# Patient Record
Sex: Male | Born: 1940
Health system: Southern US, Community
[De-identification: ages and names within clinical notes are randomized; demographics above are authoritative.]

## PROBLEM LIST (undated history)

## (undated) DIAGNOSIS — L989 Disorder of the skin and subcutaneous tissue, unspecified: Secondary | ICD-10-CM

## (undated) DIAGNOSIS — F419 Anxiety disorder, unspecified: Secondary | ICD-10-CM

## (undated) DIAGNOSIS — I6529 Occlusion and stenosis of unspecified carotid artery: Secondary | ICD-10-CM

## (undated) DIAGNOSIS — N400 Enlarged prostate without lower urinary tract symptoms: Secondary | ICD-10-CM

## (undated) DIAGNOSIS — M1990 Unspecified osteoarthritis, unspecified site: Secondary | ICD-10-CM

## (undated) HISTORY — DX: Unspecified osteoarthritis, unspecified site: M19.90

## (undated) HISTORY — DX: Anxiety disorder, unspecified: F41.9

## (undated) HISTORY — PX: NO PAST SURGERIES: SHX2092

## (undated) HISTORY — DX: Benign prostatic hyperplasia without lower urinary tract symptoms: N40.0

## (undated) HISTORY — DX: Disorder of the skin and subcutaneous tissue, unspecified: L98.9

## (undated) HISTORY — DX: Occlusion and stenosis of unspecified carotid artery: I65.29

## (undated) HISTORY — PX: CORONARY ARTERY BYPASS GRAFT: SHX141

---

## 2004-01-25 ENCOUNTER — Inpatient Hospital Stay (HOSPITAL_COMMUNITY): Admission: AD | Admit: 2004-01-25 | Discharge: 2004-01-31 | Payer: Self-pay | Admitting: Cardiology

## 2004-02-25 ENCOUNTER — Encounter: Admission: RE | Admit: 2004-02-25 | Discharge: 2004-02-25 | Payer: Self-pay | Admitting: Allergy and Immunology

## 2005-04-07 IMAGING — CR DG CHEST 2V
2 series · 2 of 2 positions shown · non-contrast
Comparison: none

CLINICAL DATA: Chest pain. 
 PA AND LATERAL CHEST ([DATE] A.M.)  
 No comparisons.

[view not recorded (1 of 2)]
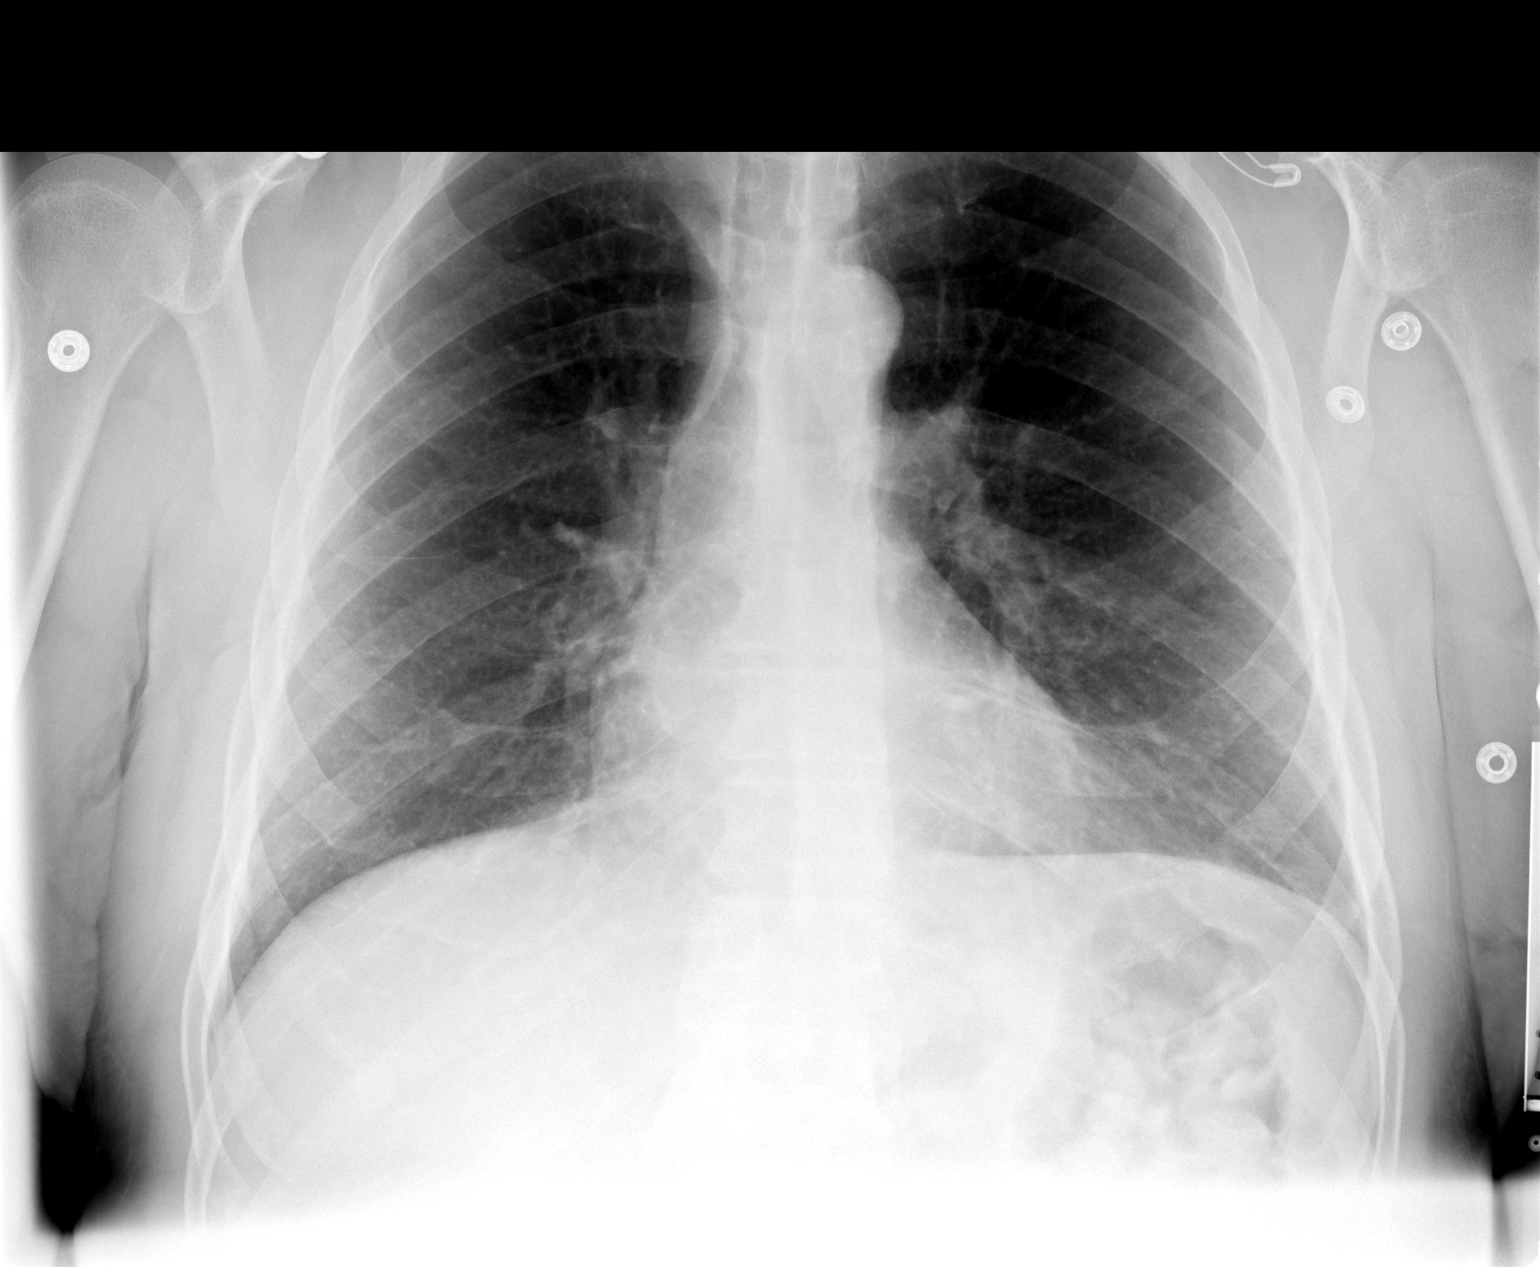

[view not recorded (2 of 2)]
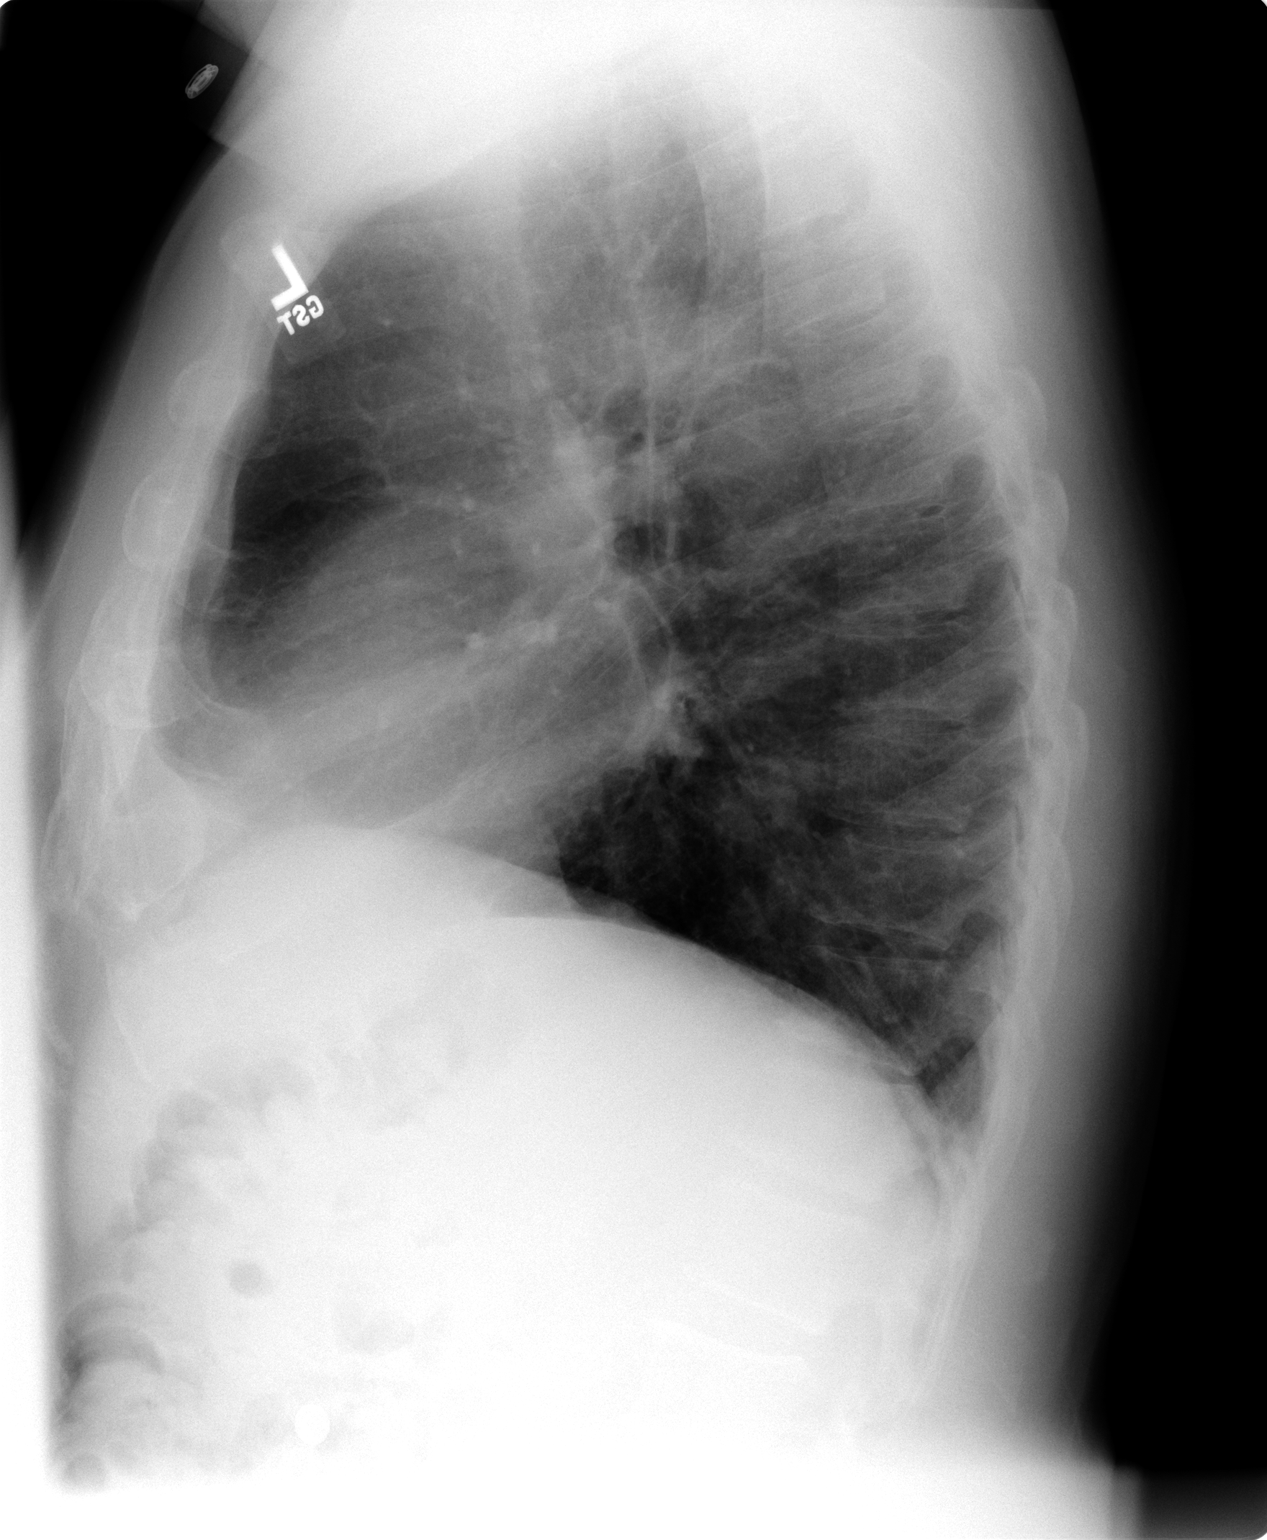

[2 of 2 positions shown; findings below may reference images not displayed]

FINDINGS: The heart size is top normal.  No evidence of infiltrate or congestive heart failure.  
 IMPRESSION
 No evidence of acute abnormality.

## 2005-04-08 IMAGING — CR DG CHEST 1V PORT
1 series · 1 of 1 positions shown · non-contrast
Comparison: none

CLINICAL DATA: CABG.  Chest pain.  
 PORTABLE CHEST ONE VIEW 01/27/04 AT 1269 HOURS
 Patient is status post CABG.  Portable film at 1269 hours.  ET tube, NG tube, Swan Ganz catheter, mediastinal tube, left chest tube all in good position.  Bibasilar subsegmental atelectatic change left greater than right with small effusions.  No pneumothorax. 
 IMPRESSION
 Satisfactory postoperative appearance with slight worsening aeration compared to preoperative film.

[view not recorded]
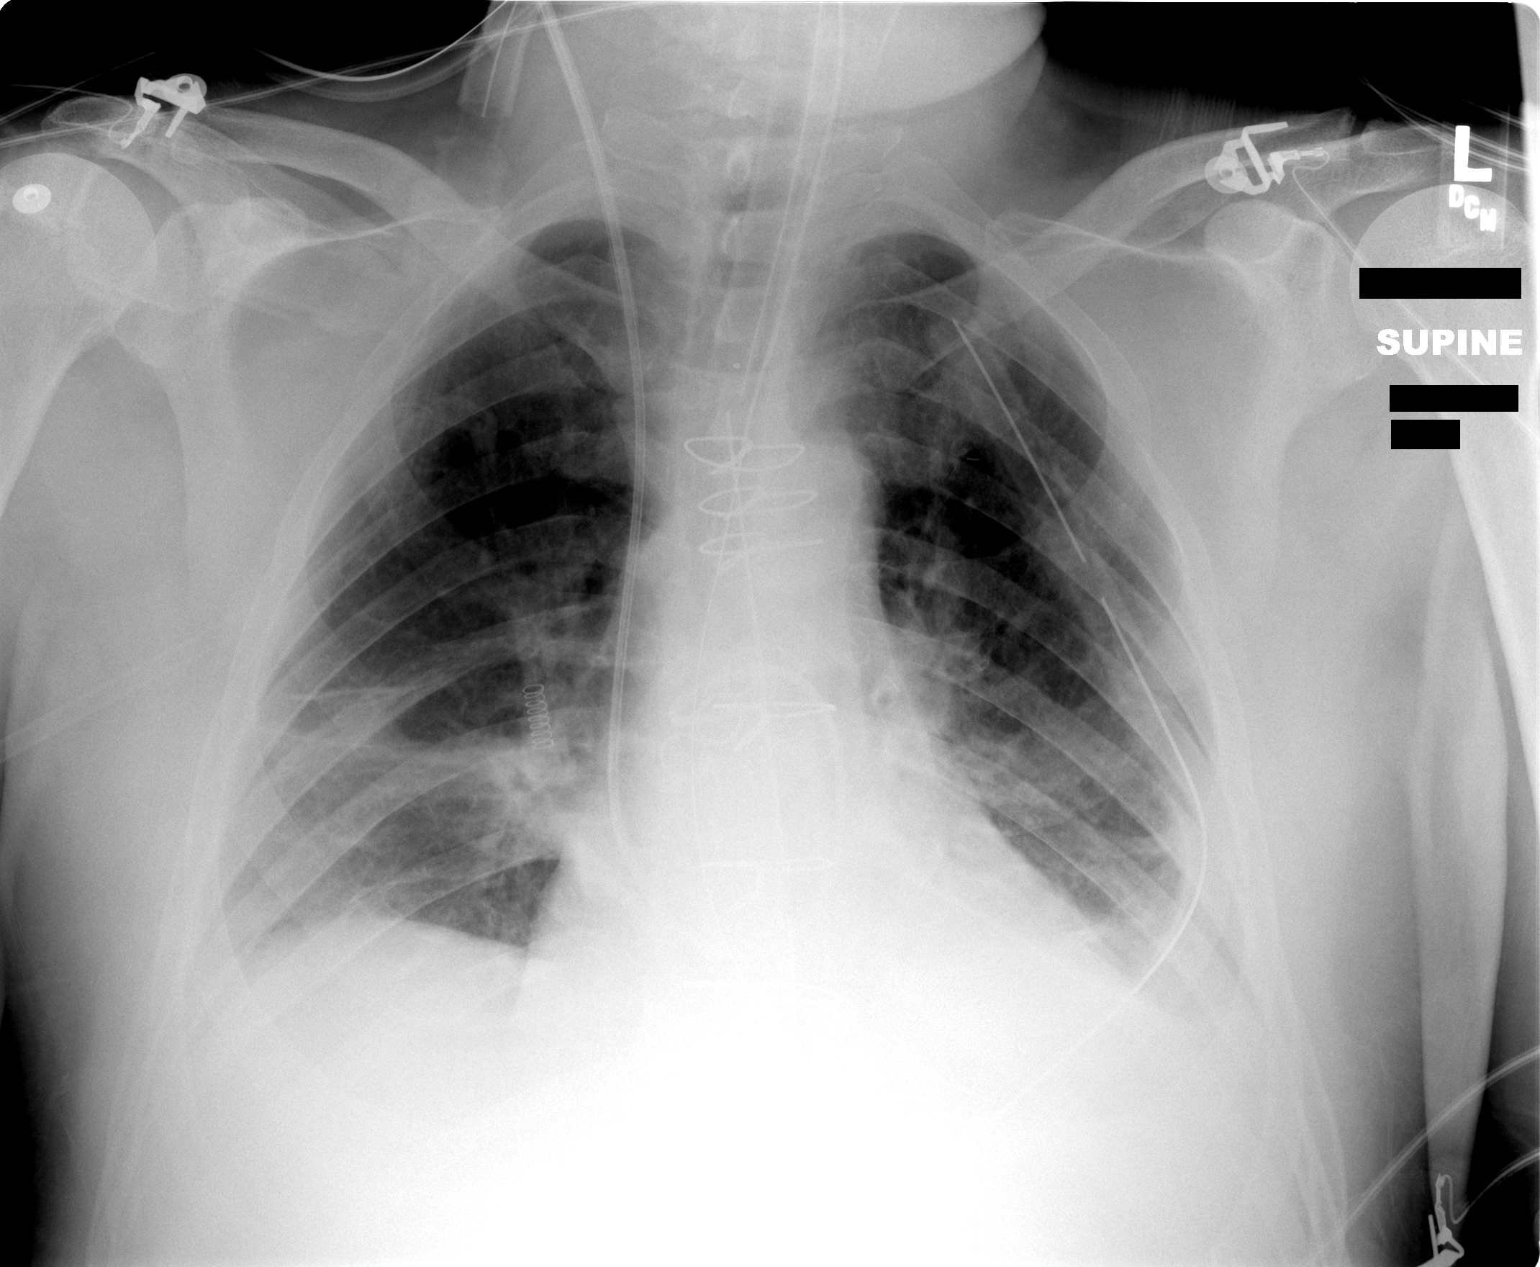

[1 of 1 positions shown; findings below may reference images not displayed]

## 2005-04-09 IMAGING — CR DG CHEST 1V PORT
1 series · 1 of 1 positions shown · non-contrast
Comparison: 01/27/04
 The patient is status post extubation.

CLINICAL DATA: Status post CABG procedure. 
 PORTABLE CHEST ? 01/28/04

[view not recorded]
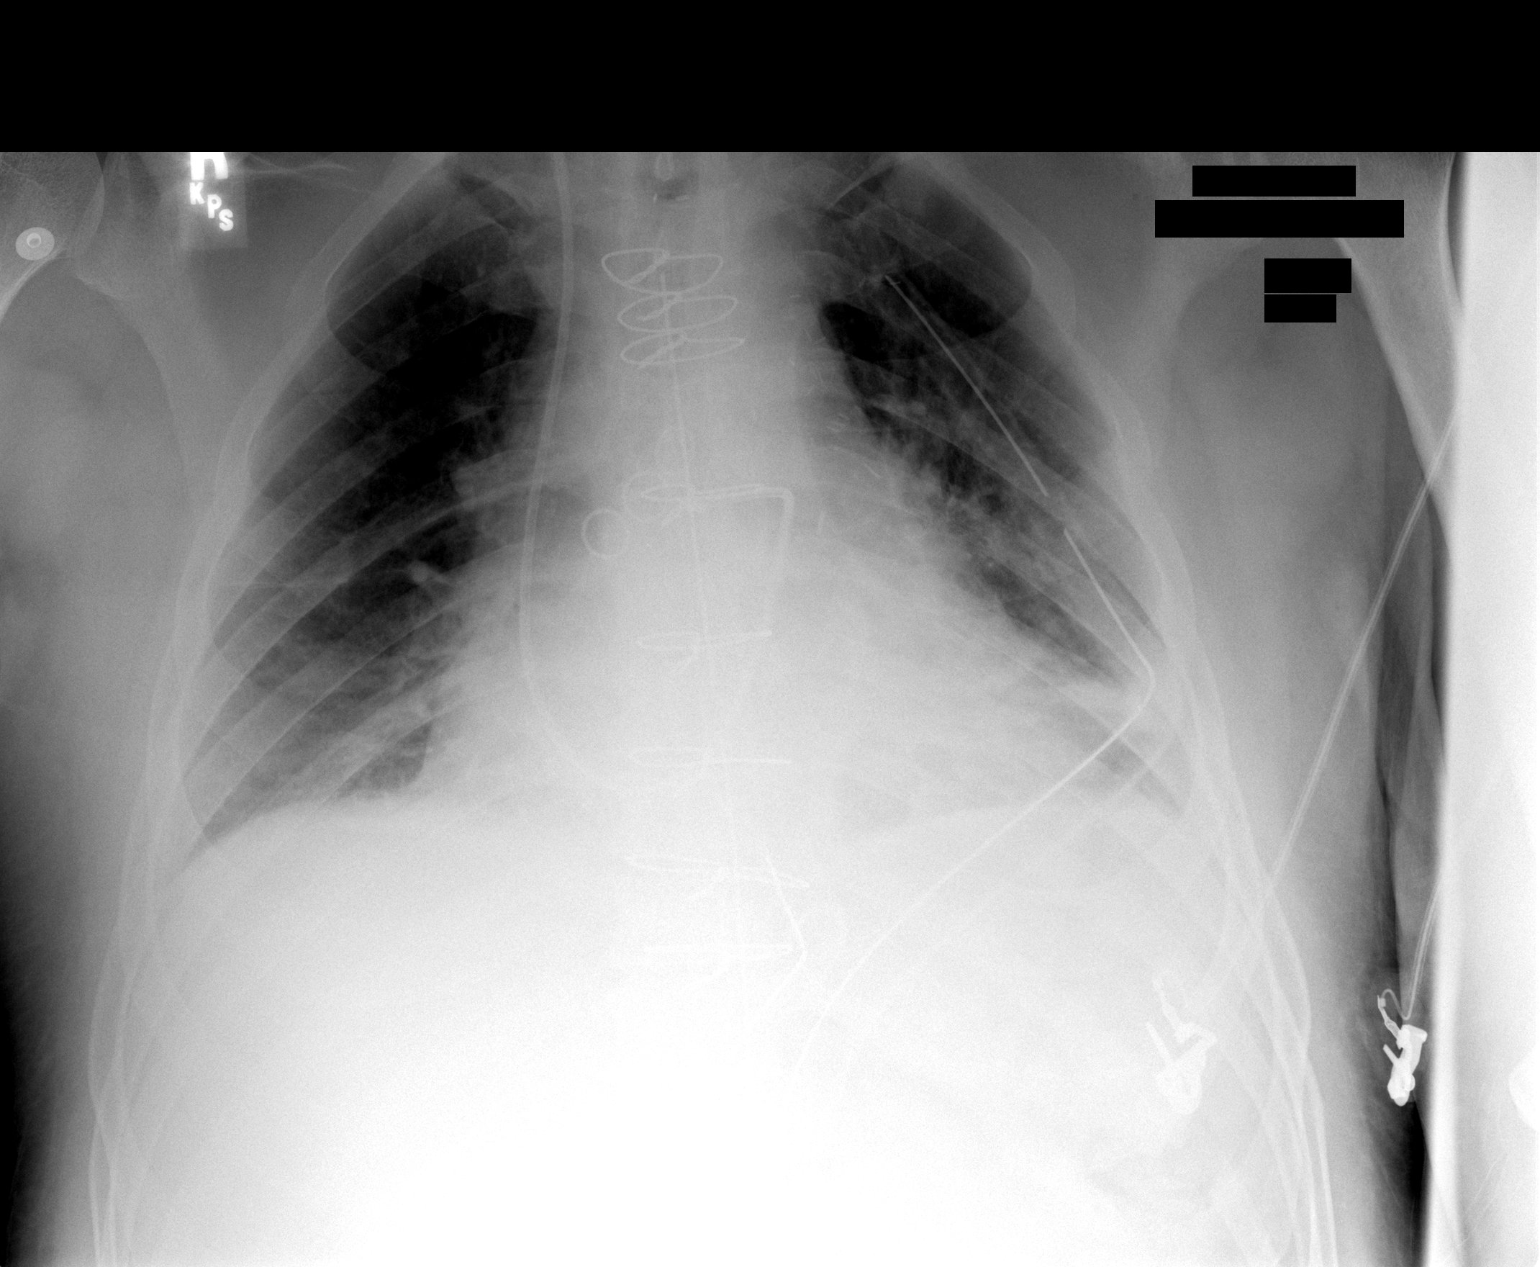

[1 of 1 positions shown; findings below may reference images not displayed]

Swan-ganz catheter and left chest tube remain in satisfactory position.  Bilateral subsegmental atelectasis.  Vascular congestion without frank edema.  Increased lingular atelectasis noted.  
 IMPRESSION 
 Follow-up extubation with increased atelectasis.

## 2015-10-26 DIAGNOSIS — H401133 Primary open-angle glaucoma, bilateral, severe stage: Secondary | ICD-10-CM | POA: Diagnosis not present

## 2015-11-30 DIAGNOSIS — H9193 Unspecified hearing loss, bilateral: Secondary | ICD-10-CM | POA: Diagnosis not present

## 2015-12-30 DIAGNOSIS — Z0000 Encounter for general adult medical examination without abnormal findings: Secondary | ICD-10-CM | POA: Diagnosis not present

## 2016-02-15 DIAGNOSIS — I10 Essential (primary) hypertension: Secondary | ICD-10-CM | POA: Diagnosis not present

## 2016-03-01 DIAGNOSIS — H401133 Primary open-angle glaucoma, bilateral, severe stage: Secondary | ICD-10-CM | POA: Diagnosis not present

## 2016-07-03 DIAGNOSIS — H401133 Primary open-angle glaucoma, bilateral, severe stage: Secondary | ICD-10-CM | POA: Diagnosis not present

## 2016-07-20 DIAGNOSIS — H903 Sensorineural hearing loss, bilateral: Secondary | ICD-10-CM | POA: Diagnosis not present

## 2016-09-01 DIAGNOSIS — N3090 Cystitis, unspecified without hematuria: Secondary | ICD-10-CM | POA: Diagnosis not present

## 2016-09-01 DIAGNOSIS — R300 Dysuria: Secondary | ICD-10-CM | POA: Diagnosis not present

## 2016-09-10 DIAGNOSIS — Z23 Encounter for immunization: Secondary | ICD-10-CM | POA: Diagnosis not present

## 2016-11-15 DIAGNOSIS — H401133 Primary open-angle glaucoma, bilateral, severe stage: Secondary | ICD-10-CM | POA: Diagnosis not present

## 2016-11-27 DIAGNOSIS — E785 Hyperlipidemia, unspecified: Secondary | ICD-10-CM | POA: Diagnosis not present

## 2016-11-27 DIAGNOSIS — Z0000 Encounter for general adult medical examination without abnormal findings: Secondary | ICD-10-CM | POA: Diagnosis not present

## 2016-11-27 DIAGNOSIS — Z6827 Body mass index (BMI) 27.0-27.9, adult: Secondary | ICD-10-CM | POA: Diagnosis not present

## 2016-11-27 DIAGNOSIS — I10 Essential (primary) hypertension: Secondary | ICD-10-CM | POA: Diagnosis not present

## 2017-01-09 DIAGNOSIS — E785 Hyperlipidemia, unspecified: Secondary | ICD-10-CM | POA: Diagnosis not present

## 2017-01-09 DIAGNOSIS — Z79899 Other long term (current) drug therapy: Secondary | ICD-10-CM | POA: Diagnosis not present

## 2017-01-09 DIAGNOSIS — Z0000 Encounter for general adult medical examination without abnormal findings: Secondary | ICD-10-CM | POA: Diagnosis not present

## 2017-01-09 DIAGNOSIS — D519 Vitamin B12 deficiency anemia, unspecified: Secondary | ICD-10-CM | POA: Diagnosis not present

## 2017-01-14 DIAGNOSIS — E538 Deficiency of other specified B group vitamins: Secondary | ICD-10-CM | POA: Diagnosis not present

## 2017-01-18 DIAGNOSIS — E538 Deficiency of other specified B group vitamins: Secondary | ICD-10-CM | POA: Diagnosis not present

## 2017-01-24 DIAGNOSIS — E538 Deficiency of other specified B group vitamins: Secondary | ICD-10-CM | POA: Diagnosis not present

## 2017-01-31 DIAGNOSIS — J0100 Acute maxillary sinusitis, unspecified: Secondary | ICD-10-CM | POA: Diagnosis not present

## 2017-01-31 DIAGNOSIS — J209 Acute bronchitis, unspecified: Secondary | ICD-10-CM | POA: Diagnosis not present

## 2017-02-04 DIAGNOSIS — D510 Vitamin B12 deficiency anemia due to intrinsic factor deficiency: Secondary | ICD-10-CM | POA: Diagnosis not present

## 2017-02-18 DIAGNOSIS — H401133 Primary open-angle glaucoma, bilateral, severe stage: Secondary | ICD-10-CM | POA: Diagnosis not present

## 2017-05-30 DIAGNOSIS — H35341 Macular cyst, hole, or pseudohole, right eye: Secondary | ICD-10-CM | POA: Diagnosis not present

## 2017-05-30 DIAGNOSIS — H26492 Other secondary cataract, left eye: Secondary | ICD-10-CM | POA: Diagnosis not present

## 2017-09-04 DIAGNOSIS — H8102 Meniere's disease, left ear: Secondary | ICD-10-CM | POA: Diagnosis not present

## 2017-09-04 DIAGNOSIS — H9313 Tinnitus, bilateral: Secondary | ICD-10-CM | POA: Diagnosis not present

## 2017-09-04 DIAGNOSIS — H60312 Diffuse otitis externa, left ear: Secondary | ICD-10-CM | POA: Diagnosis not present

## 2017-09-04 DIAGNOSIS — H8109 Meniere's disease, unspecified ear: Secondary | ICD-10-CM

## 2017-09-04 DIAGNOSIS — Z87891 Personal history of nicotine dependence: Secondary | ICD-10-CM | POA: Diagnosis not present

## 2017-09-04 DIAGNOSIS — Z974 Presence of external hearing-aid: Secondary | ICD-10-CM | POA: Diagnosis not present

## 2017-09-04 HISTORY — DX: Meniere's disease, unspecified ear: H81.09

## 2017-09-04 HISTORY — DX: Diffuse otitis externa, left ear: H60.312

## 2017-09-10 DIAGNOSIS — R69 Illness, unspecified: Secondary | ICD-10-CM | POA: Diagnosis not present

## 2017-09-24 DIAGNOSIS — R2681 Unsteadiness on feet: Secondary | ICD-10-CM | POA: Diagnosis not present

## 2017-09-24 DIAGNOSIS — R42 Dizziness and giddiness: Secondary | ICD-10-CM | POA: Diagnosis not present

## 2017-09-24 DIAGNOSIS — M542 Cervicalgia: Secondary | ICD-10-CM | POA: Diagnosis not present

## 2017-09-30 DIAGNOSIS — R2681 Unsteadiness on feet: Secondary | ICD-10-CM | POA: Diagnosis not present

## 2017-09-30 DIAGNOSIS — M542 Cervicalgia: Secondary | ICD-10-CM | POA: Diagnosis not present

## 2017-09-30 DIAGNOSIS — R42 Dizziness and giddiness: Secondary | ICD-10-CM | POA: Diagnosis not present

## 2017-10-04 DIAGNOSIS — R42 Dizziness and giddiness: Secondary | ICD-10-CM | POA: Diagnosis not present

## 2017-10-04 DIAGNOSIS — M542 Cervicalgia: Secondary | ICD-10-CM | POA: Diagnosis not present

## 2017-10-04 DIAGNOSIS — R2681 Unsteadiness on feet: Secondary | ICD-10-CM | POA: Diagnosis not present

## 2017-10-14 DIAGNOSIS — M542 Cervicalgia: Secondary | ICD-10-CM | POA: Diagnosis not present

## 2017-10-14 DIAGNOSIS — R42 Dizziness and giddiness: Secondary | ICD-10-CM | POA: Diagnosis not present

## 2017-10-14 DIAGNOSIS — R2681 Unsteadiness on feet: Secondary | ICD-10-CM | POA: Diagnosis not present

## 2017-10-18 DIAGNOSIS — R2681 Unsteadiness on feet: Secondary | ICD-10-CM | POA: Diagnosis not present

## 2017-10-18 DIAGNOSIS — R42 Dizziness and giddiness: Secondary | ICD-10-CM | POA: Diagnosis not present

## 2017-10-18 DIAGNOSIS — M542 Cervicalgia: Secondary | ICD-10-CM | POA: Diagnosis not present

## 2017-10-28 DIAGNOSIS — R2681 Unsteadiness on feet: Secondary | ICD-10-CM | POA: Diagnosis not present

## 2017-10-28 DIAGNOSIS — R42 Dizziness and giddiness: Secondary | ICD-10-CM | POA: Diagnosis not present

## 2017-10-28 DIAGNOSIS — M542 Cervicalgia: Secondary | ICD-10-CM | POA: Diagnosis not present

## 2017-11-04 DIAGNOSIS — M542 Cervicalgia: Secondary | ICD-10-CM | POA: Diagnosis not present

## 2017-11-04 DIAGNOSIS — R2681 Unsteadiness on feet: Secondary | ICD-10-CM | POA: Diagnosis not present

## 2017-11-04 DIAGNOSIS — R42 Dizziness and giddiness: Secondary | ICD-10-CM | POA: Diagnosis not present

## 2017-11-20 DIAGNOSIS — I10 Essential (primary) hypertension: Secondary | ICD-10-CM | POA: Diagnosis not present

## 2017-11-20 DIAGNOSIS — K4090 Unilateral inguinal hernia, without obstruction or gangrene, not specified as recurrent: Secondary | ICD-10-CM | POA: Diagnosis not present

## 2017-11-20 DIAGNOSIS — R109 Unspecified abdominal pain: Secondary | ICD-10-CM | POA: Diagnosis not present

## 2018-01-02 DIAGNOSIS — J0100 Acute maxillary sinusitis, unspecified: Secondary | ICD-10-CM | POA: Diagnosis not present

## 2018-01-15 DIAGNOSIS — Z1339 Encounter for screening examination for other mental health and behavioral disorders: Secondary | ICD-10-CM | POA: Diagnosis not present

## 2018-01-15 DIAGNOSIS — Z9181 History of falling: Secondary | ICD-10-CM | POA: Diagnosis not present

## 2018-01-15 DIAGNOSIS — Z0000 Encounter for general adult medical examination without abnormal findings: Secondary | ICD-10-CM | POA: Diagnosis not present

## 2018-01-15 DIAGNOSIS — Z6827 Body mass index (BMI) 27.0-27.9, adult: Secondary | ICD-10-CM | POA: Diagnosis not present

## 2018-01-15 DIAGNOSIS — Z79899 Other long term (current) drug therapy: Secondary | ICD-10-CM | POA: Diagnosis not present

## 2018-01-15 DIAGNOSIS — Z1331 Encounter for screening for depression: Secondary | ICD-10-CM | POA: Diagnosis not present

## 2018-01-15 DIAGNOSIS — E785 Hyperlipidemia, unspecified: Secondary | ICD-10-CM | POA: Diagnosis not present

## 2018-01-15 DIAGNOSIS — E538 Deficiency of other specified B group vitamins: Secondary | ICD-10-CM | POA: Diagnosis not present

## 2018-02-07 DIAGNOSIS — J209 Acute bronchitis, unspecified: Secondary | ICD-10-CM | POA: Diagnosis not present

## 2018-02-10 DIAGNOSIS — J189 Pneumonia, unspecified organism: Secondary | ICD-10-CM | POA: Diagnosis not present

## 2018-02-17 DIAGNOSIS — J189 Pneumonia, unspecified organism: Secondary | ICD-10-CM | POA: Diagnosis not present

## 2018-02-17 DIAGNOSIS — J0100 Acute maxillary sinusitis, unspecified: Secondary | ICD-10-CM | POA: Diagnosis not present

## 2018-02-26 DIAGNOSIS — H401133 Primary open-angle glaucoma, bilateral, severe stage: Secondary | ICD-10-CM | POA: Diagnosis not present

## 2018-07-07 DIAGNOSIS — J0100 Acute maxillary sinusitis, unspecified: Secondary | ICD-10-CM | POA: Diagnosis not present

## 2018-07-09 DIAGNOSIS — E785 Hyperlipidemia, unspecified: Secondary | ICD-10-CM | POA: Diagnosis not present

## 2018-07-09 DIAGNOSIS — M1990 Unspecified osteoarthritis, unspecified site: Secondary | ICD-10-CM | POA: Diagnosis not present

## 2018-07-09 DIAGNOSIS — I2510 Atherosclerotic heart disease of native coronary artery without angina pectoris: Secondary | ICD-10-CM | POA: Diagnosis not present

## 2018-07-09 DIAGNOSIS — H04129 Dry eye syndrome of unspecified lacrimal gland: Secondary | ICD-10-CM | POA: Diagnosis not present

## 2018-07-09 DIAGNOSIS — J309 Allergic rhinitis, unspecified: Secondary | ICD-10-CM | POA: Diagnosis not present

## 2018-07-09 DIAGNOSIS — H547 Unspecified visual loss: Secondary | ICD-10-CM | POA: Diagnosis not present

## 2018-07-09 DIAGNOSIS — H409 Unspecified glaucoma: Secondary | ICD-10-CM | POA: Diagnosis not present

## 2018-07-09 DIAGNOSIS — I10 Essential (primary) hypertension: Secondary | ICD-10-CM | POA: Diagnosis not present

## 2018-07-09 DIAGNOSIS — G8929 Other chronic pain: Secondary | ICD-10-CM | POA: Diagnosis not present

## 2018-07-09 DIAGNOSIS — H269 Unspecified cataract: Secondary | ICD-10-CM | POA: Diagnosis not present

## 2018-07-10 DIAGNOSIS — H401133 Primary open-angle glaucoma, bilateral, severe stage: Secondary | ICD-10-CM | POA: Diagnosis not present

## 2018-08-28 DIAGNOSIS — Z23 Encounter for immunization: Secondary | ICD-10-CM | POA: Diagnosis not present

## 2018-08-31 DIAGNOSIS — R300 Dysuria: Secondary | ICD-10-CM | POA: Diagnosis not present

## 2018-08-31 DIAGNOSIS — R1084 Generalized abdominal pain: Secondary | ICD-10-CM | POA: Diagnosis not present

## 2018-08-31 DIAGNOSIS — K591 Functional diarrhea: Secondary | ICD-10-CM | POA: Diagnosis not present

## 2018-08-31 DIAGNOSIS — N3001 Acute cystitis with hematuria: Secondary | ICD-10-CM | POA: Diagnosis not present

## 2018-09-05 DIAGNOSIS — I10 Essential (primary) hypertension: Secondary | ICD-10-CM | POA: Diagnosis not present

## 2018-09-05 DIAGNOSIS — E7800 Pure hypercholesterolemia, unspecified: Secondary | ICD-10-CM | POA: Diagnosis not present

## 2018-09-05 DIAGNOSIS — Z6826 Body mass index (BMI) 26.0-26.9, adult: Secondary | ICD-10-CM | POA: Diagnosis not present

## 2018-09-05 DIAGNOSIS — K5900 Constipation, unspecified: Secondary | ICD-10-CM | POA: Diagnosis not present

## 2018-11-17 DIAGNOSIS — H401133 Primary open-angle glaucoma, bilateral, severe stage: Secondary | ICD-10-CM | POA: Diagnosis not present

## 2019-01-20 DIAGNOSIS — N3001 Acute cystitis with hematuria: Secondary | ICD-10-CM | POA: Diagnosis not present

## 2019-01-20 DIAGNOSIS — R509 Fever, unspecified: Secondary | ICD-10-CM | POA: Diagnosis not present

## 2019-01-20 DIAGNOSIS — N3090 Cystitis, unspecified without hematuria: Secondary | ICD-10-CM | POA: Diagnosis not present

## 2019-03-05 DIAGNOSIS — Z1331 Encounter for screening for depression: Secondary | ICD-10-CM | POA: Diagnosis not present

## 2019-03-05 DIAGNOSIS — E785 Hyperlipidemia, unspecified: Secondary | ICD-10-CM | POA: Diagnosis not present

## 2019-03-05 DIAGNOSIS — Z0000 Encounter for general adult medical examination without abnormal findings: Secondary | ICD-10-CM | POA: Diagnosis not present

## 2019-03-05 DIAGNOSIS — Z79899 Other long term (current) drug therapy: Secondary | ICD-10-CM | POA: Diagnosis not present

## 2019-03-05 DIAGNOSIS — D519 Vitamin B12 deficiency anemia, unspecified: Secondary | ICD-10-CM | POA: Diagnosis not present

## 2019-03-05 DIAGNOSIS — Z9181 History of falling: Secondary | ICD-10-CM | POA: Diagnosis not present

## 2019-03-05 DIAGNOSIS — Z6826 Body mass index (BMI) 26.0-26.9, adult: Secondary | ICD-10-CM | POA: Diagnosis not present

## 2019-04-16 DIAGNOSIS — H524 Presbyopia: Secondary | ICD-10-CM | POA: Diagnosis not present

## 2019-04-16 DIAGNOSIS — H401133 Primary open-angle glaucoma, bilateral, severe stage: Secondary | ICD-10-CM | POA: Diagnosis not present

## 2019-06-06 DIAGNOSIS — C44319 Basal cell carcinoma of skin of other parts of face: Secondary | ICD-10-CM | POA: Diagnosis not present

## 2019-06-06 DIAGNOSIS — L720 Epidermal cyst: Secondary | ICD-10-CM | POA: Diagnosis not present

## 2019-06-06 DIAGNOSIS — L853 Xerosis cutis: Secondary | ICD-10-CM | POA: Diagnosis not present

## 2019-06-17 DIAGNOSIS — C44319 Basal cell carcinoma of skin of other parts of face: Secondary | ICD-10-CM | POA: Diagnosis not present

## 2019-08-20 DIAGNOSIS — H401133 Primary open-angle glaucoma, bilateral, severe stage: Secondary | ICD-10-CM | POA: Diagnosis not present

## 2019-08-30 DIAGNOSIS — I959 Hypotension, unspecified: Secondary | ICD-10-CM | POA: Diagnosis not present

## 2019-08-30 DIAGNOSIS — E860 Dehydration: Secondary | ICD-10-CM | POA: Diagnosis not present

## 2019-08-30 DIAGNOSIS — R531 Weakness: Secondary | ICD-10-CM | POA: Diagnosis not present

## 2019-08-30 DIAGNOSIS — R000 Tachycardia, unspecified: Secondary | ICD-10-CM | POA: Diagnosis not present

## 2019-09-11 DIAGNOSIS — Z23 Encounter for immunization: Secondary | ICD-10-CM | POA: Diagnosis not present

## 2019-09-15 DIAGNOSIS — L821 Other seborrheic keratosis: Secondary | ICD-10-CM | POA: Diagnosis not present

## 2019-09-15 DIAGNOSIS — L570 Actinic keratosis: Secondary | ICD-10-CM | POA: Diagnosis not present

## 2019-09-15 DIAGNOSIS — L853 Xerosis cutis: Secondary | ICD-10-CM | POA: Diagnosis not present

## 2019-11-26 DIAGNOSIS — Z008 Encounter for other general examination: Secondary | ICD-10-CM | POA: Diagnosis not present

## 2019-11-26 DIAGNOSIS — I10 Essential (primary) hypertension: Secondary | ICD-10-CM | POA: Diagnosis not present

## 2019-11-26 DIAGNOSIS — I2510 Atherosclerotic heart disease of native coronary artery without angina pectoris: Secondary | ICD-10-CM | POA: Diagnosis not present

## 2019-11-26 DIAGNOSIS — H409 Unspecified glaucoma: Secondary | ICD-10-CM | POA: Diagnosis not present

## 2019-11-26 DIAGNOSIS — Z809 Family history of malignant neoplasm, unspecified: Secondary | ICD-10-CM | POA: Diagnosis not present

## 2019-11-26 DIAGNOSIS — H04129 Dry eye syndrome of unspecified lacrimal gland: Secondary | ICD-10-CM | POA: Diagnosis not present

## 2019-11-26 DIAGNOSIS — M1990 Unspecified osteoarthritis, unspecified site: Secondary | ICD-10-CM | POA: Diagnosis not present

## 2019-11-26 DIAGNOSIS — Z7982 Long term (current) use of aspirin: Secondary | ICD-10-CM | POA: Diagnosis not present

## 2019-11-26 DIAGNOSIS — Z8249 Family history of ischemic heart disease and other diseases of the circulatory system: Secondary | ICD-10-CM | POA: Diagnosis not present

## 2020-02-11 DIAGNOSIS — H401133 Primary open-angle glaucoma, bilateral, severe stage: Secondary | ICD-10-CM | POA: Diagnosis not present

## 2020-03-07 DIAGNOSIS — Z0000 Encounter for general adult medical examination without abnormal findings: Secondary | ICD-10-CM | POA: Diagnosis not present

## 2020-03-07 DIAGNOSIS — E785 Hyperlipidemia, unspecified: Secondary | ICD-10-CM | POA: Diagnosis not present

## 2020-03-07 DIAGNOSIS — Z79899 Other long term (current) drug therapy: Secondary | ICD-10-CM | POA: Diagnosis not present

## 2020-03-07 DIAGNOSIS — D519 Vitamin B12 deficiency anemia, unspecified: Secondary | ICD-10-CM | POA: Diagnosis not present

## 2020-03-07 DIAGNOSIS — Z6826 Body mass index (BMI) 26.0-26.9, adult: Secondary | ICD-10-CM | POA: Diagnosis not present

## 2020-03-23 DIAGNOSIS — R9431 Abnormal electrocardiogram [ECG] [EKG]: Secondary | ICD-10-CM | POA: Diagnosis not present

## 2020-03-23 DIAGNOSIS — M1990 Unspecified osteoarthritis, unspecified site: Secondary | ICD-10-CM | POA: Diagnosis not present

## 2020-03-23 DIAGNOSIS — I10 Essential (primary) hypertension: Secondary | ICD-10-CM | POA: Diagnosis not present

## 2020-03-23 DIAGNOSIS — Z951 Presence of aortocoronary bypass graft: Secondary | ICD-10-CM | POA: Diagnosis not present

## 2020-03-23 DIAGNOSIS — R002 Palpitations: Secondary | ICD-10-CM | POA: Diagnosis not present

## 2020-03-23 DIAGNOSIS — Z79899 Other long term (current) drug therapy: Secondary | ICD-10-CM | POA: Diagnosis not present

## 2020-03-23 DIAGNOSIS — I2510 Atherosclerotic heart disease of native coronary artery without angina pectoris: Secondary | ICD-10-CM | POA: Diagnosis not present

## 2020-03-23 DIAGNOSIS — R778 Other specified abnormalities of plasma proteins: Secondary | ICD-10-CM | POA: Diagnosis not present

## 2020-03-23 DIAGNOSIS — Z7982 Long term (current) use of aspirin: Secondary | ICD-10-CM | POA: Diagnosis not present

## 2020-03-23 DIAGNOSIS — I495 Sick sinus syndrome: Secondary | ICD-10-CM | POA: Diagnosis not present

## 2020-03-23 DIAGNOSIS — E785 Hyperlipidemia, unspecified: Secondary | ICD-10-CM | POA: Diagnosis not present

## 2020-03-23 DIAGNOSIS — R079 Chest pain, unspecified: Secondary | ICD-10-CM | POA: Diagnosis not present

## 2020-03-23 DIAGNOSIS — E860 Dehydration: Secondary | ICD-10-CM | POA: Diagnosis not present

## 2020-03-23 DIAGNOSIS — R000 Tachycardia, unspecified: Secondary | ICD-10-CM | POA: Diagnosis not present

## 2020-03-24 DIAGNOSIS — I2510 Atherosclerotic heart disease of native coronary artery without angina pectoris: Secondary | ICD-10-CM | POA: Diagnosis not present

## 2020-03-24 DIAGNOSIS — E785 Hyperlipidemia, unspecified: Secondary | ICD-10-CM | POA: Diagnosis not present

## 2020-03-24 DIAGNOSIS — I340 Nonrheumatic mitral (valve) insufficiency: Secondary | ICD-10-CM | POA: Diagnosis not present

## 2020-03-24 DIAGNOSIS — I471 Supraventricular tachycardia: Secondary | ICD-10-CM | POA: Diagnosis not present

## 2020-03-24 DIAGNOSIS — I361 Nonrheumatic tricuspid (valve) insufficiency: Secondary | ICD-10-CM | POA: Diagnosis not present

## 2020-03-24 DIAGNOSIS — R778 Other specified abnormalities of plasma proteins: Secondary | ICD-10-CM | POA: Diagnosis not present

## 2020-03-25 DIAGNOSIS — R079 Chest pain, unspecified: Secondary | ICD-10-CM | POA: Diagnosis not present

## 2020-03-25 DIAGNOSIS — Z951 Presence of aortocoronary bypass graft: Secondary | ICD-10-CM | POA: Diagnosis not present

## 2020-03-25 DIAGNOSIS — I361 Nonrheumatic tricuspid (valve) insufficiency: Secondary | ICD-10-CM | POA: Diagnosis not present

## 2020-03-25 DIAGNOSIS — I10 Essential (primary) hypertension: Secondary | ICD-10-CM | POA: Diagnosis not present

## 2020-03-25 DIAGNOSIS — I495 Sick sinus syndrome: Secondary | ICD-10-CM | POA: Diagnosis not present

## 2020-03-25 DIAGNOSIS — R5383 Other fatigue: Secondary | ICD-10-CM | POA: Diagnosis not present

## 2020-03-25 DIAGNOSIS — I340 Nonrheumatic mitral (valve) insufficiency: Secondary | ICD-10-CM | POA: Diagnosis not present

## 2020-03-25 DIAGNOSIS — Z79899 Other long term (current) drug therapy: Secondary | ICD-10-CM | POA: Diagnosis not present

## 2020-03-25 DIAGNOSIS — I471 Supraventricular tachycardia: Secondary | ICD-10-CM | POA: Diagnosis not present

## 2020-03-25 DIAGNOSIS — M1990 Unspecified osteoarthritis, unspecified site: Secondary | ICD-10-CM | POA: Diagnosis not present

## 2020-03-25 DIAGNOSIS — Z7982 Long term (current) use of aspirin: Secondary | ICD-10-CM | POA: Diagnosis not present

## 2020-03-25 DIAGNOSIS — E785 Hyperlipidemia, unspecified: Secondary | ICD-10-CM | POA: Diagnosis not present

## 2020-03-25 DIAGNOSIS — R778 Other specified abnormalities of plasma proteins: Secondary | ICD-10-CM | POA: Diagnosis not present

## 2020-03-25 DIAGNOSIS — I2510 Atherosclerotic heart disease of native coronary artery without angina pectoris: Secondary | ICD-10-CM | POA: Diagnosis not present

## 2020-03-28 ENCOUNTER — Telehealth: Payer: Self-pay | Admitting: Cardiology

## 2020-03-28 NOTE — Telephone Encounter (Signed)
   Pt called, he said he was discharge at [facility] and need a f/u appt with Dr. Agustin Cree  Please advise

## 2020-03-31 DIAGNOSIS — Z6826 Body mass index (BMI) 26.0-26.9, adult: Secondary | ICD-10-CM | POA: Diagnosis not present

## 2020-03-31 DIAGNOSIS — Z9181 History of falling: Secondary | ICD-10-CM | POA: Diagnosis not present

## 2020-03-31 DIAGNOSIS — I259 Chronic ischemic heart disease, unspecified: Secondary | ICD-10-CM | POA: Diagnosis not present

## 2020-03-31 DIAGNOSIS — I10 Essential (primary) hypertension: Secondary | ICD-10-CM | POA: Diagnosis not present

## 2020-03-31 DIAGNOSIS — Z1331 Encounter for screening for depression: Secondary | ICD-10-CM | POA: Diagnosis not present

## 2020-03-31 DIAGNOSIS — R69 Illness, unspecified: Secondary | ICD-10-CM | POA: Diagnosis not present

## 2020-04-11 ENCOUNTER — Encounter: Payer: Self-pay | Admitting: Cardiology

## 2020-04-11 ENCOUNTER — Other Ambulatory Visit: Payer: Self-pay

## 2020-04-11 ENCOUNTER — Ambulatory Visit: Payer: Medicare HMO | Admitting: Cardiology

## 2020-04-11 VITALS — BP 124/58 | HR 47 | Ht 69.5 in | Wt 171.4 lb

## 2020-04-11 DIAGNOSIS — I10 Essential (primary) hypertension: Secondary | ICD-10-CM | POA: Diagnosis not present

## 2020-04-11 DIAGNOSIS — E785 Hyperlipidemia, unspecified: Secondary | ICD-10-CM | POA: Diagnosis not present

## 2020-04-11 DIAGNOSIS — Z951 Presence of aortocoronary bypass graft: Secondary | ICD-10-CM | POA: Diagnosis not present

## 2020-04-11 DIAGNOSIS — R001 Bradycardia, unspecified: Secondary | ICD-10-CM

## 2020-04-11 DIAGNOSIS — I495 Sick sinus syndrome: Secondary | ICD-10-CM | POA: Diagnosis not present

## 2020-04-11 DIAGNOSIS — I2510 Atherosclerotic heart disease of native coronary artery without angina pectoris: Secondary | ICD-10-CM | POA: Diagnosis not present

## 2020-04-11 HISTORY — DX: Presence of aortocoronary bypass graft: Z95.1

## 2020-04-11 HISTORY — DX: Essential (primary) hypertension: I10

## 2020-04-11 HISTORY — DX: Hyperlipidemia, unspecified: E78.5

## 2020-04-11 HISTORY — DX: Atherosclerotic heart disease of native coronary artery without angina pectoris: I25.10

## 2020-04-11 HISTORY — DX: Bradycardia, unspecified: R00.1

## 2020-04-11 NOTE — Patient Instructions (Signed)
Medication Instructions:  Your physician recommends that you continue on your current medications as directed. Please refer to the Current Medication list given to you today.  *If you need a refill on your cardiac medications before your next appointment, please call your pharmacy*   Lab Work: None.  If you have labs (blood work) drawn today and your tests are completely normal, you will receive your results only by: Marland Kitchen MyChart Message (if you have MyChart) OR . A paper copy in the mail If you have any lab test that is abnormal or we need to change your treatment, we will call you to review the results.   Testing/Procedures: A zio monitor was ordered today. It will remain on for 14 days. You will then return monitor and event diary in provided box. It takes 1-2 weeks for report to be downloaded and returned to [place]. We will call you with the results. If monitor falls off or has orange flashing light, please call Zio for further instructions.      Follow-Up: At [facility], you and your health needs are our priority.  As part of our continuing mission to provide you with exceptional heart care, we have created designated Provider Care Teams.  These Care Teams include your primary Cardiologist (physician) and Advanced Practice Providers (APPs -  Physician Assistants and Nurse Practitioners) who all work together to provide you with the care you need, when you need it.  We recommend signing up for the patient portal called "MyChart".  Sign up information is provided on this After Visit Summary.  MyChart is used to connect with patients for Virtual Visits (Telemedicine).  Patients are able to view lab/test results, encounter notes, upcoming appointments, etc.  Non-urgent messages can be sent to your provider as well.   To learn more about what you can do with MyChart, go to [email or web address].    Your next appointment:   6 week(s)  The format for your next appointment:   In  Person  Provider:   Jenne Campus, MD   Other Instructions

## 2020-04-11 NOTE — Progress Notes (Signed)
Cardiology Office Note:    Date:  04/11/2020   ID:  Johnny Allen, DOB November 27, 1941, MRN [id number]  PCP:  Patient, No Pcp Per  Cardiologist:  Jenne Campus, MD    Referring MD: No ref. provider found   No chief complaint on file. I was in the hospital  History of Present Illness:    Johnny Allen is a 79 y.o. male with past medical history significant for coronary artery disease, status post coronary artery bypass grafting in 2005, essential hypertension, sinus bradycardia.  Recently he ended up coming to the hospital because of hypotension and tachycardia, he was find to have narrow complex tachycardia look like SVT AV nodal reentry tachycardia.  He also had minimal elevation of troponin I.  While in the hospital stress test was done which showed no evidence of ischemia.  We had difficulty putting him on any AV blocking agent because of resting bradycardia.  Eventually he was discharged home and comes today to my office for follow-up.  He is very hard of hearing, therefore, conversation somewhat difficult.  His daughter-in-law who is with him and majority of conversation goes with her.  He described to have episode of dizziness or rather balance problem.  No passing out no syncope.  Denies have any chest pain, tightness, pressure, burning in the chest.  No past medical history on file.    Current Medications: Current Meds  Medication Sig  . aspirin EC 81 MG tablet Take 81 mg by mouth daily.  Marland Kitchen atorvastatin (LIPITOR) 10 MG tablet Take 10 mg by mouth at bedtime.  Marland Kitchen CRANBERRY ULTRA STRENGTH PO Take 15,000 mg by mouth daily.  . Cyanocobalamin (B-12) 2500 MCG TABS Take by mouth daily.  . dorzolamide-timolol (COSOPT) 22.3-6.8 MG/ML ophthalmic solution 1 drop 2 (two) times daily.  Marland Kitchen escitalopram (LEXAPRO) 10 MG tablet Take 10 mg by mouth daily.  . hydrochlorothiazide (HYDRODIURIL) 12.5 MG tablet Take 12.5 mg by mouth daily.  . irbesartan (AVAPRO) 150 MG tablet Take 150 mg by mouth  daily.  . timolol (TIMOPTIC) 0.5 % ophthalmic solution INSTILL 1 TO 3 DROPS INTO BOTH EYES TWICE A DAY     Allergies:   Patient has no allergy information on record.   Social History   Socioeconomic History  . Marital status: Married    Spouse name: Not on file  . Number of children: Not on file  . Years of education: Not on file  . Highest education level: Not on file  Occupational History  . Not on file  Tobacco Use  . Smoking status: Never Smoker  . Smokeless tobacco: Never Used  Substance and Sexual Activity  . Alcohol use: Not on file  . Drug use: Not on file  . Sexual activity: Not on file  Other Topics Concern  . Not on file  Social History Narrative  . Not on file   Social Determinants of Health   Financial Resource Strain:   . Difficulty of Paying Living Expenses:   Food Insecurity:   . Worried About Charity fundraiser in the Last Year:   . Arboriculturist in the Last Year:   Transportation Needs:   . Film/video editor (Medical):   Marland Kitchen Lack of Transportation (Non-Medical):   Physical Activity:   . Days of Exercise per Week:   . Minutes of Exercise per Session:   Stress:   . Feeling of Stress :   Social Connections:   . Frequency of Communication with  Friends and Family:   . Frequency of Social Gatherings with Friends and Family:   . Attends Religious Services:   . Active Member of Clubs or Organizations:   . Attends Archivist Meetings:   Marland Kitchen Marital Status:      Family History: The patient's family history is not on file. ROS:   Please see the history of present illness.    All 14 point review of systems negative except as described per history of present illness  EKGs/Labs/Other Studies Reviewed:      Recent Labs: No results found for requested labs within last 8760 hours.  Recent Lipid Panel No results found for: CHOL, TRIG, HDL, CHOLHDL, VLDL, LDLCALC, LDLDIRECT  Physical Exam:    VS:  BP (!) 124/58   Pulse (!) 47   Ht 5'  9.5" (1.765 m)   Wt 171 lb 6.4 oz (77.7 kg)   SpO2 97%   BMI 24.95 kg/m     Wt Readings from Last 3 Encounters:  04/11/20 171 lb 6.4 oz (77.7 kg)     GEN:  Well nourished, well developed in no acute distress HEENT: Normal NECK: No JVD; No carotid bruits LYMPHATICS: No lymphadenopathy CARDIAC: RRR, no murmurs, no rubs, no gallops RESPIRATORY:  Clear to auscultation without rales, wheezing or rhonchi  ABDOMEN: Soft, non-tender, non-distended MUSCULOSKELETAL:  No edema; No deformity  SKIN: Warm and dry LOWER EXTREMITIES: no swelling NEUROLOGIC:  Alert and oriented x 3 PSYCHIATRIC:  Normal affect   ASSESSMENT:    1. Tachycardia-bradycardia syndrome ([place])   2. Coronary artery disease involving native coronary artery of native heart without angina pectoris   3. 16 y ago   4. Essential hypertension   5. Sinus bradycardia   6. Dyslipidemia    PLAN:    In order of problems listed above:  1. Tachycardia-bradycardia syndrome quite complicated situation.  I will ask him to wear Zio patch for 2 weeks to see exactly how much tacky arrhythmia how much bradyarrhythmia we have.  Based on that we will decide about therapy.  He may require pacemaker.  We will see what we find on the monitor.  I did talk to his daughter-in-law explained to her with the rational for potential pacing is.  However, overall he is asymptomatic therefore at this for now we will continue monitoring. 2. Coronary artery disease, status post coronary bypass graft done 16 years ago, he did have minimal elevation of troponin I while in the hospital, recent stress test done in the hospital showed no evidence of ischemia. 3. Essential hypertension blood pressure well controlled continue present management. 4. Sinus bradycardia: Plan as outlined above. 5. Dyslipidemia: He is on statin however only 10 mg of Lipitor, will increase to 40 mg.  He need to be on high intensity statin.   Medication Adjustments/Labs and Tests  Ordered: Current medicines are reviewed at length with the patient today.  Concerns regarding medicines are outlined above.  Orders Placed This Encounter  Procedures  . LONG TERM MONITOR (3-14 DAYS)  . EKG 12-Lead   Medication changes: No orders of the defined types were placed in this encounter.   Signed, Park Liter, MD, [facility] 04/11/2020 12:44 PM    [place]

## 2020-04-17 ENCOUNTER — Ambulatory Visit (INDEPENDENT_AMBULATORY_CARE_PROVIDER_SITE_OTHER): Payer: Medicare HMO

## 2020-04-17 DIAGNOSIS — I495 Sick sinus syndrome: Secondary | ICD-10-CM

## 2020-05-17 DIAGNOSIS — E7800 Pure hypercholesterolemia, unspecified: Secondary | ICD-10-CM | POA: Diagnosis not present

## 2020-05-17 DIAGNOSIS — R358 Other polyuria: Secondary | ICD-10-CM | POA: Diagnosis not present

## 2020-05-17 DIAGNOSIS — Z6827 Body mass index (BMI) 27.0-27.9, adult: Secondary | ICD-10-CM | POA: Diagnosis not present

## 2020-05-17 DIAGNOSIS — I10 Essential (primary) hypertension: Secondary | ICD-10-CM | POA: Diagnosis not present

## 2020-05-19 DIAGNOSIS — R001 Bradycardia, unspecified: Secondary | ICD-10-CM | POA: Diagnosis not present

## 2020-05-24 ENCOUNTER — Ambulatory Visit: Payer: Medicare HMO | Admitting: Cardiology

## 2020-05-24 ENCOUNTER — Encounter: Payer: Self-pay | Admitting: Cardiology

## 2020-05-24 ENCOUNTER — Other Ambulatory Visit: Payer: Self-pay

## 2020-05-24 VITALS — BP 140/70 | HR 63 | Ht 69.5 in | Wt 173.8 lb

## 2020-05-24 DIAGNOSIS — I2510 Atherosclerotic heart disease of native coronary artery without angina pectoris: Secondary | ICD-10-CM

## 2020-05-24 DIAGNOSIS — I10 Essential (primary) hypertension: Secondary | ICD-10-CM | POA: Diagnosis not present

## 2020-05-24 DIAGNOSIS — I471 Supraventricular tachycardia, unspecified: Secondary | ICD-10-CM

## 2020-05-24 DIAGNOSIS — R001 Bradycardia, unspecified: Secondary | ICD-10-CM

## 2020-05-24 DIAGNOSIS — E785 Hyperlipidemia, unspecified: Secondary | ICD-10-CM | POA: Diagnosis not present

## 2020-05-24 DIAGNOSIS — Z951 Presence of aortocoronary bypass graft: Secondary | ICD-10-CM

## 2020-05-24 HISTORY — DX: Supraventricular tachycardia: I47.1

## 2020-05-24 HISTORY — DX: Supraventricular tachycardia, unspecified: I47.10

## 2020-05-24 NOTE — Patient Instructions (Signed)
Medication Instructions:  Your physician recommends that you continue on your current medications as directed. Please refer to the Current Medication list given to you today.  *If you need a refill on your cardiac medications before your next appointment, please call your pharmacy*   Lab Work:  If you have labs (blood work) drawn today and your tests are completely normal, you will receive your results only by: Marland Kitchen MyChart Message (if you have MyChart) OR . A paper copy in the mail If you have any lab test that is abnormal or we need to change your treatment, we will call you to review the results.   Testing/Procedures: None.    Follow-Up: At [facility], you and your health needs are our priority.  As part of our continuing mission to provide you with exceptional heart care, we have created designated Provider Care Teams.  These Care Teams include your primary Cardiologist (physician) and Advanced Practice Providers (APPs -  Physician Assistants and Nurse Practitioners) who all work together to provide you with the care you need, when you need it.  We recommend signing up for the patient portal called "MyChart".  Sign up information is provided on this After Visit Summary.  MyChart is used to connect with patients for Virtual Visits (Telemedicine).  Patients are able to view lab/test results, encounter notes, upcoming appointments, etc.  Non-urgent messages can be sent to your provider as well.   To learn more about what you can do with MyChart, go to [email or web address].    Your next appointment:   3 month(s)  The format for your next appointment:   In Person  Provider:   Jenne Campus, MD   Other Instructions

## 2020-05-24 NOTE — Progress Notes (Signed)
Cardiology Office Note:    Date:  05/24/2020   ID:  [name], DOB 06-06-1941, MRN [id number]  PCP:  Angelina Sheriff, MD  Cardiologist:  Jenne Campus, MD    Referring MD: No ref. provider found   Chief Complaint  Patient presents with  . Follow-up    6 WK FU   Doing well  History of Present Illness:    Johnny Allen is a 79 y.o. male with past medical history significant for coronary artery disease, status post coronary bypass grafting 2005, essential hypertension, sinus bradycardia.  Recently he ended up coming to round of hospital with narrow complex tachycardia look like AV nodal reentry SVT.  He also had minimal elevation of troponin.  Stress test was done at that time showed no evidence of ischemia.  He was not put on any AV blocking agent because of sinus bradycardia.  He came to me for follow-up.  Overall he is doing well.  He walks on the regular basis he walks every day in the morning and have no difficulty doing it.  Sometimes he notes his blood pressure being slightly on the lower side when he finishes exercise in the morning therefore he does not take medications before he go for a walk he takes this after he came back from his work.  No dizziness no palpitations no passing out.  He did wear a Zio patch which showed slowest heart rate of 38 and happen at night.  The only one very short episode of SVT only 5 beats in a row.  No symptoms.  No past medical history on file.    Current Medications: Current Meds  Medication Sig  . aspirin EC 81 MG tablet Take 81 mg by mouth daily.  Marland Kitchen atorvastatin (LIPITOR) 10 MG tablet Take 10 mg by mouth at bedtime.  Marland Kitchen CRANBERRY ULTRA STRENGTH PO Take 15,000 mg by mouth daily.  . Cyanocobalamin (B-12) 2500 MCG TABS Take by mouth daily.  . dorzolamide-timolol (COSOPT) 22.3-6.8 MG/ML ophthalmic solution 1 drop 2 (two) times daily.  Marland Kitchen escitalopram (LEXAPRO) 10 MG tablet Take 10 mg by mouth daily.  . irbesartan (AVAPRO) 150 MG  tablet Take 150 mg by mouth daily.  . tamsulosin (FLOMAX) 0.4 MG CAPS capsule Take 0.4 mg by mouth daily.  . timolol (TIMOPTIC) 0.5 % ophthalmic solution INSTILL 1 TO 3 DROPS INTO BOTH EYES TWICE A DAY     Allergies:   Patient has no allergy information on record.   Social History   Socioeconomic History  . Marital status: Married    Spouse name: Not on file  . Number of children: Not on file  . Years of education: Not on file  . Highest education level: Not on file  Occupational History  . Not on file  Tobacco Use  . Smoking status: Never Smoker  . Smokeless tobacco: Never Used  Substance and Sexual Activity  . Alcohol use: Not on file  . Drug use: Not on file  . Sexual activity: Not on file  Other Topics Concern  . Not on file  Social History Narrative  . Not on file   Social Determinants of Health   Financial Resource Strain:   . Difficulty of Paying Living Expenses:   Food Insecurity:   . Worried About Charity fundraiser in the Last Year:   . Arboriculturist in the Last Year:   Transportation Needs:   . Film/video editor (Medical):   Marland Kitchen  Lack of Transportation (Non-Medical):   Physical Activity:   . Days of Exercise per Week:   . Minutes of Exercise per Session:   Stress:   . Feeling of Stress :   Social Connections:   . Frequency of Communication with Friends and Family:   . Frequency of Social Gatherings with Friends and Family:   . Attends Religious Services:   . Active Member of Clubs or Organizations:   . Attends Archivist Meetings:   Marland Kitchen Marital Status:      Family History: The patient's family history is not on file. ROS:   Please see the history of present illness.    All 14 point review of systems negative except as described per history of present illness  EKGs/Labs/Other Studies Reviewed:      Recent Labs: No results found for requested labs within last 8760 hours.  Recent Lipid Panel No results found for: CHOL, TRIG, HDL,  CHOLHDL, VLDL, LDLCALC, LDLDIRECT  Physical Exam:    VS:  BP 140/70 (BP Location: Right Arm, Patient Position: Sitting, Cuff Size: Normal)   Pulse 63   Ht 5' 9.5" (1.765 m)   Wt 173 lb 12.8 oz (78.8 kg)   SpO2 97%   BMI 25.30 kg/m     Wt Readings from Last 3 Encounters:  05/24/20 173 lb 12.8 oz (78.8 kg)  04/11/20 171 lb 6.4 oz (77.7 kg)     GEN:  Well nourished, well developed in no acute distress HEENT: Normal NECK: No JVD; No carotid bruits LYMPHATICS: No lymphadenopathy CARDIAC: RRR, no murmurs, no rubs, no gallops RESPIRATORY:  Clear to auscultation without rales, wheezing or rhonchi  ABDOMEN: Soft, non-tender, non-distended MUSCULOSKELETAL:  No edema; No deformity  SKIN: Warm and dry LOWER EXTREMITIES: no swelling NEUROLOGIC:  Alert and oriented x 3 PSYCHIATRIC:  Normal affect   ASSESSMENT:    1. Sinus bradycardia   2. Essential hypertension   3. Coronary artery disease involving native coronary artery of native heart without angina pectoris   4. Status post coronary artery bypass graft   5. Dyslipidemia   6. SVT (supraventricular tachycardia) (HCC)    PLAN:    In order of problems listed above:  1. Sinus bradycardia.  Not critical.  We will continue monitoring asked him to let me know if he became dizzy pass out or have some palpitations.  In the future he still may require pacemaker because of tachybradycardia behavior.  But for now I think we have things under control.   2. Essential hypertension his blood pressure is controlled today 140/70.  We will continue present management. 3. Coronary artery disease status post coronary bypass graft years ago we will continue present management. 4. Dyslipidemia last time increase dose of his Lipitor we will continue present management.  He will have his fasting lipid profile done by his primary care physician.   Medication Adjustments/Labs and Tests Ordered: Current medicines are reviewed at length with the patient  today.  Concerns regarding medicines are outlined above.  No orders of the defined types were placed in this encounter.  Medication changes: No orders of the defined types were placed in this encounter.   Signed, Park Liter, MD, [facility] 05/24/2020 4:19 PM    [place]

## 2020-06-14 ENCOUNTER — Telehealth: Payer: Self-pay | Admitting: Cardiology

## 2020-06-14 DIAGNOSIS — I10 Essential (primary) hypertension: Secondary | ICD-10-CM | POA: Diagnosis not present

## 2020-06-14 DIAGNOSIS — R600 Localized edema: Secondary | ICD-10-CM | POA: Diagnosis not present

## 2020-06-14 DIAGNOSIS — Z6827 Body mass index (BMI) 27.0-27.9, adult: Secondary | ICD-10-CM | POA: Diagnosis not present

## 2020-06-14 DIAGNOSIS — N400 Enlarged prostate without lower urinary tract symptoms: Secondary | ICD-10-CM | POA: Diagnosis not present

## 2020-06-14 NOTE — Telephone Encounter (Signed)
Attempted to contact granddaughter back. No answer and no voicemail will continue efforts.

## 2020-06-14 NOTE — Telephone Encounter (Signed)
New message   Please call the patient back per grand daughter to go over heart monitor results.

## 2020-06-15 NOTE — Telephone Encounter (Signed)
Johnny Allen is returning Johnny Allen's call.

## 2020-06-16 NOTE — Telephone Encounter (Signed)
Attempted to call granddaughter again, there was no answer. Called the patient and spoke with him and gave him monitor results. No further questions.

## 2020-09-12 ENCOUNTER — Other Ambulatory Visit: Payer: Self-pay

## 2020-09-12 ENCOUNTER — Ambulatory Visit (INDEPENDENT_AMBULATORY_CARE_PROVIDER_SITE_OTHER): Payer: Medicare HMO | Admitting: Cardiology

## 2020-09-12 VITALS — BP 134/74 | HR 80 | Ht 69.5 in | Wt 177.6 lb

## 2020-09-12 DIAGNOSIS — I10 Essential (primary) hypertension: Secondary | ICD-10-CM | POA: Diagnosis not present

## 2020-09-12 DIAGNOSIS — E785 Hyperlipidemia, unspecified: Secondary | ICD-10-CM | POA: Diagnosis not present

## 2020-09-12 DIAGNOSIS — R001 Bradycardia, unspecified: Secondary | ICD-10-CM

## 2020-09-12 DIAGNOSIS — I2510 Atherosclerotic heart disease of native coronary artery without angina pectoris: Secondary | ICD-10-CM

## 2020-09-12 DIAGNOSIS — I471 Supraventricular tachycardia: Secondary | ICD-10-CM

## 2020-09-12 DIAGNOSIS — Z951 Presence of aortocoronary bypass graft: Secondary | ICD-10-CM

## 2020-09-12 NOTE — Patient Instructions (Signed)

## 2020-09-12 NOTE — Progress Notes (Signed)
Cardiology Office Note:    Date:  09/12/2020   ID:  [name], DOB [birth year], MRN [id number]  PCP:  Angelina Sheriff, MD  Cardiologist:  Jenne Campus, MD    Referring MD: Angelina Sheriff, MD   No chief complaint on file. I am doing great  History of Present Illness:    Johnny Allen is a 79 y.o. male with past medical history significant for coronary artery disease in 2005 he did have coronary artery bypass graft, essential hypertension, SVT, sinus bradycardia.  He comes today 2 months for follow-up.  Overall doing great few months ago he end up going to the hospital because of SVT.  He was converted with adenosine, his troponin I was minimally elevated, after that he did have a stress test which showed no evidence of ischemia.  Since that time he is doing well.  Problematic issue is the fact that he does have bradycardia he wear Zio patch which showed a slow heart rate of 38 but only at night.  Luckily, he is asymptomatic of course with difficulty is his management of his tachybradycardia behavior.  However, he tells me he is doing well denies have any palpitations no dizziness.  He walks on the regular basis he gets up and goes for a walk for about half an hour 45 minutes trying to push himself and doing well doing it.  Past Medical History:  Diagnosis Date  . Acute diffuse otitis externa of left ear 09/04/2017  . Coronary artery disease 04/11/2020  . Dyslipidemia 04/11/2020  . Essential hypertension 04/11/2020  . Sinus bradycardia 04/11/2020  . Status post coronary artery bypass graft 04/11/2020  . SVT (supraventricular tachycardia) ([place]) 05/24/2020    Past Surgical History:  Procedure Laterality Date  . NO PAST SURGERIES      Current Medications: Current Meds  Medication Sig  . amLODipine (NORVASC) 5 MG tablet Take 5 mg by mouth daily.  Marland Kitchen aspirin EC 81 MG tablet Take 81 mg by mouth daily.  Marland Kitchen atorvastatin (LIPITOR) 10 MG tablet Take 10 mg by mouth at bedtime.  Marland Kitchen  CRANBERRY ULTRA STRENGTH PO Take 15,000 mg by mouth daily.  . Cyanocobalamin (B-12) 2500 MCG TABS Take by mouth daily.  . dorzolamide-timolol (COSOPT) 22.3-6.8 MG/ML ophthalmic solution 1 drop 2 (two) times daily.  Marland Kitchen escitalopram (LEXAPRO) 10 MG tablet Take 10 mg by mouth daily.  . hydrochlorothiazide (HYDRODIURIL) 12.5 MG tablet Take 12.5 mg by mouth daily as needed.  . irbesartan (AVAPRO) 150 MG tablet Take 150 mg by mouth daily.  . tamsulosin (FLOMAX) 0.4 MG CAPS capsule Take 0.4 mg by mouth daily.  . timolol (TIMOPTIC) 0.5 % ophthalmic solution INSTILL 1 TO 3 DROPS INTO BOTH EYES TWICE A DAY     Allergies:   Patient has no known allergies.   Social History   Socioeconomic History  . Marital status: Married    Spouse name: Not on file  . Number of children: Not on file  . Years of education: Not on file  . Highest education level: Not on file  Occupational History  . Not on file  Tobacco Use  . Smoking status: Never Smoker  . Smokeless tobacco: Never Used  Substance and Sexual Activity  . Alcohol use: Not on file  . Drug use: Not on file  . Sexual activity: Not on file  Other Topics Concern  . Not on file  Social History Narrative  . Not on file  Social Determinants of Health   Financial Resource Strain:   . Difficulty of Paying Living Expenses: Not on file  Food Insecurity:   . Worried About Charity fundraiser in the Last Year: Not on file  . Ran Out of Food in the Last Year: Not on file  Transportation Needs:   . Lack of Transportation (Medical): Not on file  . Lack of Transportation (Non-Medical): Not on file  Physical Activity:   . Days of Exercise per Week: Not on file  . Minutes of Exercise per Session: Not on file  Stress:   . Feeling of Stress : Not on file  Social Connections:   . Frequency of Communication with Friends and Family: Not on file  . Frequency of Social Gatherings with Friends and Family: Not on file  . Attends Religious Services: Not on  file  . Active Member of Clubs or Organizations: Not on file  . Attends Archivist Meetings: Not on file  . Marital Status: Not on file     Family History: The patient's family history is not on file. ROS:   Please see the history of present illness.    All 14 point review of systems negative except as described per history of present illness  EKGs/Labs/Other Studies Reviewed:      Recent Labs: No results found for requested labs within last 8760 hours.  Recent Lipid Panel No results found for: CHOL, TRIG, HDL, CHOLHDL, VLDL, LDLCALC, LDLDIRECT  Physical Exam:    VS:  BP 134/74   Pulse 80   Ht 5' 9.5" (1.765 m)   Wt 177 lb 9.6 oz (80.6 kg)   SpO2 97%   BMI 25.85 kg/m     Wt Readings from Last 3 Encounters:  09/12/20 177 lb 9.6 oz (80.6 kg)  05/24/20 173 lb 12.8 oz (78.8 kg)  04/11/20 171 lb 6.4 oz (77.7 kg)     GEN:  Well nourished, well developed in no acute distress HEENT: Normal NECK: No JVD; No carotid bruits LYMPHATICS: No lymphadenopathy CARDIAC: RRR, no murmurs, no rubs, no gallops RESPIRATORY:  Clear to auscultation without rales, wheezing or rhonchi  ABDOMEN: Soft, non-tender, non-distended MUSCULOSKELETAL:  No edema; No deformity  SKIN: Warm and dry LOWER EXTREMITIES: no swelling NEUROLOGIC:  Alert and oriented x 3 PSYCHIATRIC:  Normal affect   ASSESSMENT:    1. SVT (supraventricular tachycardia) ([place])   2. Sinus bradycardia   3. Essential hypertension   4. Coronary artery disease involving native coronary artery of native heart without angina pectoris   5. Status post coronary artery bypass graft   6. Dyslipidemia    PLAN:    In order of problems listed above:  1. Supraventricular tachycardia denies having any recent episodes.  Not on any medication secondary to bradycardia. 2. Sinus bradycardia, asymptomatic, noncritical continue monitoring. 3. Essential hypertension: Blood pressure well controlled continue present  medications. 4. Coronary artery disease, status post coronary bypass grafting 2009 doing well from that point of view I will continue present management. 5. Dyslipidemia: I did review his K PN I have data from his primary care physician 03/07/2020 where LDL was 74 and HDL was 50.  And this is on moderate intensity statin in form of Lipitor 10 mg daily which I will continue.  Overall he is doing well continue present management.   Medication Adjustments/Labs and Tests Ordered: Current medicines are reviewed at length with the patient today.  Concerns regarding medicines are outlined above.  No  orders of the defined types were placed in this encounter.  Medication changes: No orders of the defined types were placed in this encounter.   Signed, Park Liter, MD, [facility] 09/12/2020 9:06 AM    [place]

## 2020-10-03 DIAGNOSIS — H547 Unspecified visual loss: Secondary | ICD-10-CM | POA: Diagnosis not present

## 2020-10-03 DIAGNOSIS — N400 Enlarged prostate without lower urinary tract symptoms: Secondary | ICD-10-CM | POA: Diagnosis not present

## 2020-10-03 DIAGNOSIS — I2510 Atherosclerotic heart disease of native coronary artery without angina pectoris: Secondary | ICD-10-CM | POA: Diagnosis not present

## 2020-10-03 DIAGNOSIS — H409 Unspecified glaucoma: Secondary | ICD-10-CM | POA: Diagnosis not present

## 2020-10-03 DIAGNOSIS — J309 Allergic rhinitis, unspecified: Secondary | ICD-10-CM | POA: Diagnosis not present

## 2020-10-03 DIAGNOSIS — Z7982 Long term (current) use of aspirin: Secondary | ICD-10-CM | POA: Diagnosis not present

## 2020-10-03 DIAGNOSIS — I10 Essential (primary) hypertension: Secondary | ICD-10-CM | POA: Diagnosis not present

## 2020-10-03 DIAGNOSIS — E785 Hyperlipidemia, unspecified: Secondary | ICD-10-CM | POA: Diagnosis not present

## 2020-10-03 DIAGNOSIS — K5900 Constipation, unspecified: Secondary | ICD-10-CM | POA: Diagnosis not present

## 2020-10-03 DIAGNOSIS — M1990 Unspecified osteoarthritis, unspecified site: Secondary | ICD-10-CM | POA: Diagnosis not present

## 2020-10-07 DIAGNOSIS — L570 Actinic keratosis: Secondary | ICD-10-CM | POA: Diagnosis not present

## 2020-10-13 DIAGNOSIS — H401133 Primary open-angle glaucoma, bilateral, severe stage: Secondary | ICD-10-CM | POA: Diagnosis not present

## 2020-11-11 DIAGNOSIS — Z23 Encounter for immunization: Secondary | ICD-10-CM | POA: Diagnosis not present

## 2020-11-15 DIAGNOSIS — J40 Bronchitis, not specified as acute or chronic: Secondary | ICD-10-CM | POA: Diagnosis not present

## 2020-11-15 DIAGNOSIS — I259 Chronic ischemic heart disease, unspecified: Secondary | ICD-10-CM | POA: Diagnosis not present

## 2020-11-15 DIAGNOSIS — J329 Chronic sinusitis, unspecified: Secondary | ICD-10-CM | POA: Diagnosis not present

## 2020-11-28 DIAGNOSIS — Z20822 Contact with and (suspected) exposure to covid-19: Secondary | ICD-10-CM | POA: Diagnosis not present

## 2020-12-24 ENCOUNTER — Telehealth: Payer: Self-pay | Admitting: Nurse Practitioner

## 2020-12-24 NOTE — Telephone Encounter (Signed)
   Pt called the answering service today with complaints of tachycardia.  Yesterday while working outside, he became short of breath, and noted a rise in his HR.  He went inside and checked his BP.  BP was "low" while HR was 150.  He thought maybe he was dehydrated so he drank 2 bottles of water and symptoms abated.  Today, he was eating a sandwich and had recurrent tachycardia w/ a rate of 150.  He again drank some water and then had a bowel movement.  Following this, his heart rate normalized.  He did not experience chest pain or presyncope with either episode, but did note mild dyspnea.  He is not on an AVN blocking agent @ home in the setting of bradycardia noted on prior Zio.  I rec that he remain well hydrated, avoid caffeine and alcohol, and that if he has recurrent symptoms this weekend, that he should employ vagal maneuvers such as bearing down, but that if symptoms persisted or worsened, that he should have a low threshold to call 911.  I will forward his note to Dr. Agustin Cree, as w/ recurrent presumably SVT, he may benefit from EP evaluation.  Caller verbalized understanding and was grateful for the call back.  Murray Hodgkins, NP 12/24/2020, 5:00 PM

## 2021-03-03 DIAGNOSIS — H401133 Primary open-angle glaucoma, bilateral, severe stage: Secondary | ICD-10-CM | POA: Diagnosis not present

## 2021-03-08 DIAGNOSIS — N400 Enlarged prostate without lower urinary tract symptoms: Secondary | ICD-10-CM | POA: Diagnosis not present

## 2021-03-08 DIAGNOSIS — I10 Essential (primary) hypertension: Secondary | ICD-10-CM | POA: Diagnosis not present

## 2021-03-08 DIAGNOSIS — E7800 Pure hypercholesterolemia, unspecified: Secondary | ICD-10-CM | POA: Diagnosis not present

## 2021-03-16 DIAGNOSIS — I10 Essential (primary) hypertension: Secondary | ICD-10-CM | POA: Diagnosis not present

## 2021-03-16 DIAGNOSIS — Z79899 Other long term (current) drug therapy: Secondary | ICD-10-CM | POA: Diagnosis not present

## 2021-03-16 DIAGNOSIS — Z0000 Encounter for general adult medical examination without abnormal findings: Secondary | ICD-10-CM | POA: Diagnosis not present

## 2021-03-16 DIAGNOSIS — Z6827 Body mass index (BMI) 27.0-27.9, adult: Secondary | ICD-10-CM | POA: Diagnosis not present

## 2021-03-16 DIAGNOSIS — Z1331 Encounter for screening for depression: Secondary | ICD-10-CM | POA: Diagnosis not present

## 2021-03-24 ENCOUNTER — Other Ambulatory Visit: Payer: Self-pay

## 2021-03-24 ENCOUNTER — Ambulatory Visit (INDEPENDENT_AMBULATORY_CARE_PROVIDER_SITE_OTHER): Payer: Medicare HMO | Admitting: Cardiology

## 2021-03-24 ENCOUNTER — Encounter: Payer: Self-pay | Admitting: Cardiology

## 2021-03-24 VITALS — BP 110/78 | HR 84 | Ht 69.0 in | Wt 173.0 lb

## 2021-03-24 DIAGNOSIS — E785 Hyperlipidemia, unspecified: Secondary | ICD-10-CM

## 2021-03-24 DIAGNOSIS — R001 Bradycardia, unspecified: Secondary | ICD-10-CM

## 2021-03-24 DIAGNOSIS — Z951 Presence of aortocoronary bypass graft: Secondary | ICD-10-CM

## 2021-03-24 DIAGNOSIS — I10 Essential (primary) hypertension: Secondary | ICD-10-CM | POA: Diagnosis not present

## 2021-03-24 DIAGNOSIS — I2510 Atherosclerotic heart disease of native coronary artery without angina pectoris: Secondary | ICD-10-CM | POA: Diagnosis not present

## 2021-03-24 DIAGNOSIS — I471 Supraventricular tachycardia, unspecified: Secondary | ICD-10-CM

## 2021-03-24 NOTE — Patient Instructions (Signed)

## 2021-03-24 NOTE — Progress Notes (Signed)
Cardiology Office Note:    Date:  03/24/2021   ID:  Johnny Allen, DOB 15-May-1941, MRN [id number]  PCP:  Angelina Sheriff, MD  Cardiologist:  Jenne Campus, MD    Referring MD: Angelina Sheriff, MD   Chief Complaint  Patient presents with  . Follow-up  doing well  History of Present Illness:    Johnny Allen is a 80 y.o. male with past medical history significant for coronary artery disease in 2005 he did have coronary artery bypass graft, also have history of essential hypertension, SVT, sinus bradycardia.  He comes today to my office for follow-up.  He is doing very well he does his exercises every single morning he gets up do some weightlifting also walking have no problem doing it.  Have to admit for somebody who is 80 years old he is doing terrific is a little hard of hearing but jokes a lot with me and overall doing well.  Past Medical History:  Diagnosis Date  . Acute diffuse otitis externa of left ear 09/04/2017  . Coronary artery disease 04/11/2020  . Dyslipidemia 04/11/2020  . Essential hypertension 04/11/2020  . Meniere's disease 09/04/2017  . Sinus bradycardia 04/11/2020  . Status post coronary artery bypass graft 04/11/2020  . SVT (supraventricular tachycardia) ([place]) 05/24/2020    Past Surgical History:  Procedure Laterality Date  . NO PAST SURGERIES      Current Medications: Current Meds  Medication Sig  . aspirin EC 81 MG tablet Take 81 mg by mouth daily.  Marland Kitchen atorvastatin (LIPITOR) 10 MG tablet Take 10 mg by mouth at bedtime.  Marland Kitchen CRANBERRY ULTRA STRENGTH PO Take 15,000 mg by mouth daily.  . Cyanocobalamin (B-12) 2500 MCG TABS Take 3,000 mg by mouth daily.  . irbesartan (AVAPRO) 150 MG tablet Take 150 mg by mouth daily.  . tamsulosin (FLOMAX) 0.4 MG CAPS capsule Take 0.4 mg by mouth daily.  . vitamin C (ASCORBIC ACID) 500 MG tablet Take 500 mg by mouth daily.     Allergies:   Patient has no known allergies.   Social History   Socioeconomic History  .  Marital status: Married    Spouse name: Not on file  . Number of children: Not on file  . Years of education: Not on file  . Highest education level: Not on file  Occupational History  . Not on file  Tobacco Use  . Smoking status: Never Smoker  . Smokeless tobacco: Never Used  Substance and Sexual Activity  . Alcohol use: Not on file  . Drug use: Not on file  . Sexual activity: Not on file  Other Topics Concern  . Not on file  Social History Narrative  . Not on file   Social Determinants of Health   Financial Resource Strain: Not on file  Food Insecurity: Not on file  Transportation Needs: Not on file  Physical Activity: Not on file  Stress: Not on file  Social Connections: Not on file     Family History: The patient's family history is not on file. ROS:   Please see the history of present illness.    All 14 point review of systems negative except as described per history of present illness  EKGs/Labs/Other Studies Reviewed:      Recent Labs: No results found for requested labs within last 8760 hours.  Recent Lipid Panel No results found for: CHOL, TRIG, HDL, CHOLHDL, VLDL, LDLCALC, LDLDIRECT  Physical Exam:    VS:  BP 110/78 (BP Location: Left Arm, Patient Position: Sitting)   Pulse 84   Ht 5\' 9"  (1.753 m)   Wt 173 lb (78.5 kg)   SpO2 97%   BMI 25.55 kg/m     Wt Readings from Last 3 Encounters:  03/24/21 173 lb (78.5 kg)  09/12/20 177 lb 9.6 oz (80.6 kg)  05/24/20 173 lb 12.8 oz (78.8 kg)     GEN:  Well nourished, well developed in no acute distress HEENT: Normal NECK: No JVD; No carotid bruits LYMPHATICS: No lymphadenopathy CARDIAC: RRR, no murmurs, no rubs, no gallops RESPIRATORY:  Clear to auscultation without rales, wheezing or rhonchi  ABDOMEN: Soft, non-tender, non-distended MUSCULOSKELETAL:  No edema; No deformity  SKIN: Warm and dry LOWER EXTREMITIES: no swelling NEUROLOGIC:  Alert and oriented x 3 PSYCHIATRIC:  Normal affect    ASSESSMENT:    1. SVT (supraventricular tachycardia) ([place])   2. Sinus bradycardia   3. Essential hypertension   4. Coronary artery disease involving native coronary artery of native heart without angina pectoris   5. Status post coronary artery bypass graft   6. Dyslipidemia    PLAN:    In order of problems listed above:  1. Supraventricular tachycardia described to have some palpitations this morning this is first time since long time ago.  However yesterday he walked in the garden and he thinks he did develops allergy to pollen.  He also think he was dehydrated thinks probably right.  He took some extra water and palpitations went away. 2. Sinus bradycardia asymptomatic no dizziness no passing out. 3. Essential hypertension blood pressure well controlled continue present management. 4. Coronary disease status post coronary bypass graft stable from that point review asymptomatic we will continue monitoring. 5. Dyslipidemia he is on Lipitor 10 which I will continue.  Will call primary care physician to get his fasting lipid profile   Medication Adjustments/Labs and Tests Ordered: Current medicines are reviewed at length with the patient today.  Concerns regarding medicines are outlined above.  No orders of the defined types were placed in this encounter.  Medication changes: No orders of the defined types were placed in this encounter.   Signed, Park Liter, MD, [facility] 03/24/2021 1:35 PM    [facility]

## 2021-03-28 DIAGNOSIS — R293 Abnormal posture: Secondary | ICD-10-CM | POA: Diagnosis not present

## 2021-03-28 DIAGNOSIS — M6281 Muscle weakness (generalized): Secondary | ICD-10-CM | POA: Diagnosis not present

## 2021-03-28 DIAGNOSIS — R2681 Unsteadiness on feet: Secondary | ICD-10-CM | POA: Diagnosis not present

## 2021-03-28 DIAGNOSIS — Z7409 Other reduced mobility: Secondary | ICD-10-CM | POA: Diagnosis not present

## 2021-03-28 DIAGNOSIS — R2689 Other abnormalities of gait and mobility: Secondary | ICD-10-CM | POA: Diagnosis not present

## 2021-04-03 DIAGNOSIS — R2689 Other abnormalities of gait and mobility: Secondary | ICD-10-CM | POA: Diagnosis not present

## 2021-04-03 DIAGNOSIS — R293 Abnormal posture: Secondary | ICD-10-CM | POA: Diagnosis not present

## 2021-04-03 DIAGNOSIS — R2681 Unsteadiness on feet: Secondary | ICD-10-CM | POA: Diagnosis not present

## 2021-04-03 DIAGNOSIS — M6281 Muscle weakness (generalized): Secondary | ICD-10-CM | POA: Diagnosis not present

## 2021-04-03 DIAGNOSIS — Z7409 Other reduced mobility: Secondary | ICD-10-CM | POA: Diagnosis not present

## 2021-04-08 DIAGNOSIS — N400 Enlarged prostate without lower urinary tract symptoms: Secondary | ICD-10-CM | POA: Diagnosis not present

## 2021-04-08 DIAGNOSIS — E7800 Pure hypercholesterolemia, unspecified: Secondary | ICD-10-CM | POA: Diagnosis not present

## 2021-04-08 DIAGNOSIS — I10 Essential (primary) hypertension: Secondary | ICD-10-CM | POA: Diagnosis not present

## 2021-04-11 DIAGNOSIS — M6281 Muscle weakness (generalized): Secondary | ICD-10-CM | POA: Diagnosis not present

## 2021-04-11 DIAGNOSIS — R2689 Other abnormalities of gait and mobility: Secondary | ICD-10-CM | POA: Diagnosis not present

## 2021-04-11 DIAGNOSIS — Z7409 Other reduced mobility: Secondary | ICD-10-CM | POA: Diagnosis not present

## 2021-04-11 DIAGNOSIS — R2681 Unsteadiness on feet: Secondary | ICD-10-CM | POA: Diagnosis not present

## 2021-04-18 DIAGNOSIS — Z7409 Other reduced mobility: Secondary | ICD-10-CM | POA: Diagnosis not present

## 2021-04-18 DIAGNOSIS — M6281 Muscle weakness (generalized): Secondary | ICD-10-CM | POA: Diagnosis not present

## 2021-04-18 DIAGNOSIS — R2689 Other abnormalities of gait and mobility: Secondary | ICD-10-CM | POA: Diagnosis not present

## 2021-04-18 DIAGNOSIS — R2681 Unsteadiness on feet: Secondary | ICD-10-CM | POA: Diagnosis not present

## 2021-04-26 DIAGNOSIS — M6281 Muscle weakness (generalized): Secondary | ICD-10-CM | POA: Diagnosis not present

## 2021-04-26 DIAGNOSIS — Z7409 Other reduced mobility: Secondary | ICD-10-CM | POA: Diagnosis not present

## 2021-04-26 DIAGNOSIS — R2681 Unsteadiness on feet: Secondary | ICD-10-CM | POA: Diagnosis not present

## 2021-04-26 DIAGNOSIS — R2689 Other abnormalities of gait and mobility: Secondary | ICD-10-CM | POA: Diagnosis not present

## 2021-05-02 DIAGNOSIS — R2681 Unsteadiness on feet: Secondary | ICD-10-CM | POA: Diagnosis not present

## 2021-05-02 DIAGNOSIS — M6281 Muscle weakness (generalized): Secondary | ICD-10-CM | POA: Diagnosis not present

## 2021-05-02 DIAGNOSIS — Z7409 Other reduced mobility: Secondary | ICD-10-CM | POA: Diagnosis not present

## 2021-05-02 DIAGNOSIS — R2689 Other abnormalities of gait and mobility: Secondary | ICD-10-CM | POA: Diagnosis not present

## 2021-05-11 DIAGNOSIS — R2681 Unsteadiness on feet: Secondary | ICD-10-CM | POA: Diagnosis not present

## 2021-05-11 DIAGNOSIS — M6281 Muscle weakness (generalized): Secondary | ICD-10-CM | POA: Diagnosis not present

## 2021-05-11 DIAGNOSIS — R293 Abnormal posture: Secondary | ICD-10-CM | POA: Diagnosis not present

## 2021-05-11 DIAGNOSIS — R2689 Other abnormalities of gait and mobility: Secondary | ICD-10-CM | POA: Diagnosis not present

## 2021-05-11 DIAGNOSIS — Z7409 Other reduced mobility: Secondary | ICD-10-CM | POA: Diagnosis not present

## 2021-05-15 DIAGNOSIS — Z7409 Other reduced mobility: Secondary | ICD-10-CM | POA: Diagnosis not present

## 2021-05-15 DIAGNOSIS — M6281 Muscle weakness (generalized): Secondary | ICD-10-CM | POA: Diagnosis not present

## 2021-05-15 DIAGNOSIS — R2681 Unsteadiness on feet: Secondary | ICD-10-CM | POA: Diagnosis not present

## 2021-05-15 DIAGNOSIS — R293 Abnormal posture: Secondary | ICD-10-CM | POA: Diagnosis not present

## 2021-05-15 DIAGNOSIS — R2689 Other abnormalities of gait and mobility: Secondary | ICD-10-CM | POA: Diagnosis not present

## 2021-05-22 DIAGNOSIS — M6281 Muscle weakness (generalized): Secondary | ICD-10-CM | POA: Diagnosis not present

## 2021-05-22 DIAGNOSIS — R2681 Unsteadiness on feet: Secondary | ICD-10-CM | POA: Diagnosis not present

## 2021-05-22 DIAGNOSIS — R2689 Other abnormalities of gait and mobility: Secondary | ICD-10-CM | POA: Diagnosis not present

## 2021-05-22 DIAGNOSIS — Z7409 Other reduced mobility: Secondary | ICD-10-CM | POA: Diagnosis not present

## 2021-05-22 DIAGNOSIS — R293 Abnormal posture: Secondary | ICD-10-CM | POA: Diagnosis not present

## 2021-06-01 DIAGNOSIS — K5900 Constipation, unspecified: Secondary | ICD-10-CM | POA: Diagnosis not present

## 2021-06-01 DIAGNOSIS — G8929 Other chronic pain: Secondary | ICD-10-CM | POA: Diagnosis not present

## 2021-06-01 DIAGNOSIS — E785 Hyperlipidemia, unspecified: Secondary | ICD-10-CM | POA: Diagnosis not present

## 2021-06-01 DIAGNOSIS — N400 Enlarged prostate without lower urinary tract symptoms: Secondary | ICD-10-CM | POA: Diagnosis not present

## 2021-06-01 DIAGNOSIS — J309 Allergic rhinitis, unspecified: Secondary | ICD-10-CM | POA: Diagnosis not present

## 2021-06-01 DIAGNOSIS — I10 Essential (primary) hypertension: Secondary | ICD-10-CM | POA: Diagnosis not present

## 2021-06-01 DIAGNOSIS — M1990 Unspecified osteoarthritis, unspecified site: Secondary | ICD-10-CM | POA: Diagnosis not present

## 2021-06-01 DIAGNOSIS — H409 Unspecified glaucoma: Secondary | ICD-10-CM | POA: Diagnosis not present

## 2021-06-01 DIAGNOSIS — I2510 Atherosclerotic heart disease of native coronary artery without angina pectoris: Secondary | ICD-10-CM | POA: Diagnosis not present

## 2021-06-01 DIAGNOSIS — Z7722 Contact with and (suspected) exposure to environmental tobacco smoke (acute) (chronic): Secondary | ICD-10-CM | POA: Diagnosis not present

## 2021-06-30 DIAGNOSIS — H401133 Primary open-angle glaucoma, bilateral, severe stage: Secondary | ICD-10-CM | POA: Diagnosis not present

## 2021-06-30 DIAGNOSIS — H524 Presbyopia: Secondary | ICD-10-CM | POA: Diagnosis not present

## 2021-07-27 DIAGNOSIS — Z961 Presence of intraocular lens: Secondary | ICD-10-CM | POA: Diagnosis not present

## 2021-09-15 DIAGNOSIS — Z23 Encounter for immunization: Secondary | ICD-10-CM | POA: Diagnosis not present

## 2021-11-01 ENCOUNTER — Telehealth: Payer: Self-pay | Admitting: Cardiology

## 2021-11-01 DIAGNOSIS — R000 Tachycardia, unspecified: Secondary | ICD-10-CM | POA: Diagnosis not present

## 2021-11-01 DIAGNOSIS — Z7982 Long term (current) use of aspirin: Secondary | ICD-10-CM | POA: Diagnosis not present

## 2021-11-01 DIAGNOSIS — Z955 Presence of coronary angioplasty implant and graft: Secondary | ICD-10-CM | POA: Diagnosis not present

## 2021-11-01 DIAGNOSIS — I10 Essential (primary) hypertension: Secondary | ICD-10-CM | POA: Diagnosis not present

## 2021-11-01 DIAGNOSIS — I252 Old myocardial infarction: Secondary | ICD-10-CM | POA: Diagnosis not present

## 2021-11-01 DIAGNOSIS — E860 Dehydration: Secondary | ICD-10-CM | POA: Diagnosis not present

## 2021-11-01 DIAGNOSIS — I2510 Atherosclerotic heart disease of native coronary artery without angina pectoris: Secondary | ICD-10-CM | POA: Diagnosis not present

## 2021-11-01 DIAGNOSIS — R531 Weakness: Secondary | ICD-10-CM | POA: Diagnosis not present

## 2021-11-01 DIAGNOSIS — R002 Palpitations: Secondary | ICD-10-CM | POA: Diagnosis not present

## 2021-11-01 DIAGNOSIS — Z743 Need for continuous supervision: Secondary | ICD-10-CM | POA: Diagnosis not present

## 2021-11-01 DIAGNOSIS — R0602 Shortness of breath: Secondary | ICD-10-CM | POA: Diagnosis not present

## 2021-11-01 DIAGNOSIS — Z20822 Contact with and (suspected) exposure to covid-19: Secondary | ICD-10-CM | POA: Diagnosis not present

## 2021-11-01 NOTE — Telephone Encounter (Signed)
Tried to call no answer

## 2021-11-01 NOTE — Telephone Encounter (Signed)
STAT if HR is under 50 or over 120 (normal HR is 60-100 beats per minute)  What is your heart rate?  11/23: 139  Do you have a log of your heart rate readings (document readings)?  No   Do you have any other symptoms?  No

## 2021-11-01 NOTE — Telephone Encounter (Signed)
Pt called with episode of rapid HR today at 138 his BP at time was stable.  He has been told in the past to drink water to keep hydrated to prevent SVT, which he may have slacked off on.  He drank 2 bottles of water tonight and HR is 60.  Now his BP is elevated 170/104.  No chest pain, no SOB.  Only heart racing earlier.    Pt no longer on amlodipine.  Tonight he will take half or an avapro, then resume his regular dose tomorrow.  I did ask him to stop HCTZ, may be dehydrating him.  If further episodes he should go to ER because he has hx of bradycardia and would not be safe to order any rate slowing meds.    I will send message to Dr. Agustin Cree and arrange appt for next week.  Pt agreeable.

## 2021-11-06 NOTE — Telephone Encounter (Signed)
Left message for patient to return call for appointment.

## 2021-11-07 DIAGNOSIS — Z6826 Body mass index (BMI) 26.0-26.9, adult: Secondary | ICD-10-CM | POA: Diagnosis not present

## 2021-11-07 DIAGNOSIS — N400 Enlarged prostate without lower urinary tract symptoms: Secondary | ICD-10-CM | POA: Diagnosis not present

## 2021-11-07 DIAGNOSIS — I10 Essential (primary) hypertension: Secondary | ICD-10-CM | POA: Diagnosis not present

## 2021-11-08 ENCOUNTER — Telehealth: Payer: Self-pay | Admitting: Cardiology

## 2021-11-08 NOTE — Telephone Encounter (Signed)
Called pt to schedule an appt, but pt does not wish to be seen at this time. Pt states that he was having issues due to his sodium levels, but they are "fixed" now and he is feeling better./kbl

## 2021-11-08 NOTE — Telephone Encounter (Signed)
-----   Message from Gita Kudo, RN sent at 11/07/2021  7:19 AM EST -----  ----- Message ----- From: Isaiah Serge, NP Sent: 11/01/2021   6:54 PM EST To: [facility] Scheduling  Pt with more episodes of prob SVT then HTN.  He needs OV week of 11/28 please. Thanks. Mickel Baas

## 2022-01-19 ENCOUNTER — Telehealth: Payer: Self-pay

## 2022-01-19 NOTE — Telephone Encounter (Signed)
I will ask the [facility] scheduling team to see if they may be able to help find an appt for the pt for pre op before his surgery date of 02/05/22. I will send FYI to surgeon's office the pt needs an appt with cardiology.

## 2022-01-19 NOTE — Telephone Encounter (Signed)
° °  [facility] Pre-operative Risk Assessment    Request for surgical clearance:  What type of surgery is being performed? Left Carpal Tunnel Release    When is this surgery scheduled? 02/05/2022   What type of clearance is required (medical clearance vs. Pharmacy clearance to hold med vs. Both)? Both  Are there any medications that need to be held prior to surgery and how long?Not specified however, patient is on Aspirin 81   Practice name and name of physician performing surgery? Dr. Creig Hines at [facility]   What is your office phone number: [phone]    7.   What is your office fax number: [phone]  8.   Anesthesia type (None, local, MAC, general) ? Sedation not specified other than light sedation   Johnny Allen 01/19/2022, 12:47 PM  _________________________________________________________________   (provider comments below)

## 2022-01-19 NOTE — Telephone Encounter (Signed)
Left detailed message for the pt to call back to schedule an appt for pre op clearance. Dr. Agustin Cree at this time has an appt today at 4 pm that just opened. Hopefully we will be able to get pt in before his planned surgical date of 02/05/22.

## 2022-01-19 NOTE — Telephone Encounter (Signed)
° °  Name: Johnny Allen  DOB: 1941-10-06  MRN: [id number]  Primary Cardiologist: Jenne Campus, MD  Chart reviewed as part of pre-operative protocol coverage. Because of Johnny Allen's past medical history and time since last visit, he will require a follow-up visit in order to better assess preoperative cardiovascular risk.  Pre-op covering staff: - Please schedule appointment and call patient to inform them. If patient already had an upcoming appointment within acceptable timeframe, please add "pre-op clearance" to the appointment notes so provider is aware. - Please contact requesting surgeon's office via preferred method (i.e, phone, fax) to inform them of need for appointment prior to surgery.  If applicable, this message will also be routed to pharmacy pool and/or primary cardiologist for input on holding anticoagulant/antiplatelet agent as requested below so that this information is available to the clearing provider at time of patient's appointment.   [place], [place]  01/19/2022, 1:31 PM

## 2022-01-22 NOTE — Telephone Encounter (Signed)
Pt has been scheduled to see Dr. Agustin Cree 01/31/22 for pre op clearance. I will forward clearance notes to MD for upcoming appt. Will send FYI to requesting office the pt has appt 01/31/22.

## 2022-01-31 ENCOUNTER — Encounter: Payer: Self-pay | Admitting: Cardiology

## 2022-01-31 ENCOUNTER — Ambulatory Visit: Payer: Medicare HMO | Admitting: Cardiology

## 2022-01-31 ENCOUNTER — Other Ambulatory Visit: Payer: Self-pay

## 2022-01-31 VITALS — BP 162/84 | HR 61 | Ht 69.0 in | Wt 175.0 lb

## 2022-01-31 DIAGNOSIS — Z951 Presence of aortocoronary bypass graft: Secondary | ICD-10-CM | POA: Diagnosis not present

## 2022-01-31 DIAGNOSIS — H8103 Meniere's disease, bilateral: Secondary | ICD-10-CM

## 2022-01-31 DIAGNOSIS — R001 Bradycardia, unspecified: Secondary | ICD-10-CM | POA: Diagnosis not present

## 2022-01-31 DIAGNOSIS — I10 Essential (primary) hypertension: Secondary | ICD-10-CM | POA: Diagnosis not present

## 2022-01-31 DIAGNOSIS — Z01810 Encounter for preprocedural cardiovascular examination: Secondary | ICD-10-CM

## 2022-01-31 DIAGNOSIS — E785 Hyperlipidemia, unspecified: Secondary | ICD-10-CM

## 2022-01-31 HISTORY — DX: Encounter for preprocedural cardiovascular examination: Z01.810

## 2022-01-31 NOTE — Patient Instructions (Signed)
Medication Instructions:  Your physician recommends that you continue on your current medications as directed. Please refer to the Current Medication list given to you today.  *If you need a refill on your cardiac medications before your next appointment, please call your pharmacy*   Lab Work: None If you have labs (blood work) drawn today and your tests are completely normal, you will receive your results only by: [place] (if you have MyChart) OR A paper copy in the mail If you have any lab test that is abnormal or we need to change your treatment, we will call you to review the results.   Testing/Procedures: None   Follow-Up: At [facility], you and your health needs are our priority.  As part of our continuing mission to provide you with exceptional heart care, we have created designated Provider Care Teams.  These Care Teams include your primary Cardiologist (physician) and Advanced Practice Providers (APPs -  Physician Assistants and Nurse Practitioners) who all work together to provide you with the care you need, when you need it.  We recommend signing up for the patient portal called "MyChart".  Sign up information is provided on this After Visit Summary.  MyChart is used to connect with patients for Virtual Visits (Telemedicine).  Patients are able to view lab/test results, encounter notes, upcoming appointments, etc.  Non-urgent messages can be sent to your provider as well.   To learn more about what you can do with MyChart, go to [email or web address].    Your next appointment:   6 month(s)  The format for your next appointment:   In Person  Provider:   Jenne Campus, MD    Other Instructions 24 hour blood pressure monitor in 1 month

## 2022-01-31 NOTE — Progress Notes (Signed)
Cardiology Office Note:    Date:  01/31/2022   ID:  Johnny Allen, DOB 12/12/1940, MRN [id number]  PCP:  Angelina Sheriff, MD  Cardiologist:  Jenne Campus, MD    Referring MD: Angelina Sheriff, MD   No chief complaint on file. I need a wrist surgery  History of Present Illness:    Johnny Allen is a 81 y.o. male with past medical history significant for coronary artery disease.  In 2005 he had coronary artery bypass graft done.  Also history of supraventricular tachycardia, sinus bradycardia, essential hypertension, dyslipidemia.  He comes today to my office for follow-up.  Also he required wrist surgery which will be done in a week he required evaluation before the surgery.  Overall like always he is doing excellently.  He is 81 years old but he walks 3 to 4 miles every single day he have no difficulty doing it he said he walks very briskly.  He said a lot of people cannot keep up with him.  Denies have any complaints from cardiac standpoint of view.  There is no chest pain tightness squeezing pressure burning chest no palpitations dizziness swelling of lower extremities.  He does have some palpitations but rarely.  Past Medical History:  Diagnosis Date   Acute diffuse otitis externa of left ear 09/04/2017   Coronary artery disease 04/11/2020   Dyslipidemia 04/11/2020   Essential hypertension 04/11/2020   Meniere's disease 09/04/2017   Sinus bradycardia 04/11/2020   Status post coronary artery bypass graft 04/11/2020   SVT (supraventricular tachycardia) ([place]) 05/24/2020    Past Surgical History:  Procedure Laterality Date   NO PAST SURGERIES      Current Medications: Current Meds  Medication Sig   aspirin EC 81 MG tablet Take 81 mg by mouth daily.   atorvastatin (LIPITOR) 10 MG tablet Take 10 mg by mouth at bedtime.   CRANBERRY ULTRA STRENGTH PO Take 15,000 mg by mouth daily.   Cyanocobalamin (B-12) 2500 MCG TABS Take 3,000 mg by mouth daily.   dorzolamide-timolol  (COSOPT) 22.3-6.8 MG/ML ophthalmic solution 1 drop 2 (two) times daily.   irbesartan (AVAPRO) 150 MG tablet Take 150 mg by mouth daily.   tamsulosin (FLOMAX) 0.4 MG CAPS capsule Take 0.4 mg by mouth daily.   timolol (TIMOPTIC) 0.5 % ophthalmic solution INSTILL 1 TO 3 DROPS INTO BOTH EYES TWICE A DAY   vitamin C (ASCORBIC ACID) 500 MG tablet Take 500 mg by mouth daily.     Allergies:   Patient has no known allergies.   Social History   Socioeconomic History   Marital status: Married    Spouse name: Not on file   Number of children: Not on file   Years of education: Not on file   Highest education level: Not on file  Occupational History   Not on file  Tobacco Use   Smoking status: Former    Types: Cigarettes    Passive exposure: Past   Smokeless tobacco: Never  Vaping Use   Vaping Use: Never used  Substance and Sexual Activity   Alcohol use: Not Currently   Drug use: Never   Sexual activity: Not on file  Other Topics Concern   Not on file  Social History Narrative   Not on file   Social Determinants of Health   Financial Resource Strain: Not on file  Food Insecurity: Not on file  Transportation Needs: Not on file  Physical Activity: Not on file  Stress:  Not on file  Social Connections: Not on file     Family History: The patient's family history is not on file. ROS:   Please see the history of present illness.    All 14 point review of systems negative except as described per history of present illness  EKGs/Labs/Other Studies Reviewed:      Recent Labs: No results found for requested labs within last 8760 hours.  Recent Lipid Panel No results found for: CHOL, TRIG, HDL, CHOLHDL, VLDL, LDLCALC, LDLDIRECT  Physical Exam:    VS:  BP (!) 162/84 (BP Location: Left Arm)    Pulse 61    Ht 5\' 9"  (1.753 m)    Wt 175 lb (79.4 kg)    SpO2 98%    BMI 25.84 kg/m     Wt Readings from Last 3 Encounters:  01/31/22 175 lb (79.4 kg)  03/24/21 173 lb (78.5 kg)   09/12/20 177 lb 9.6 oz (80.6 kg)     GEN:  Well nourished, well developed in no acute distress HEENT: Normal NECK: No JVD; No carotid bruits LYMPHATICS: No lymphadenopathy CARDIAC: RRR, no murmurs, no rubs, no gallops RESPIRATORY:  Clear to auscultation without rales, wheezing or rhonchi  ABDOMEN: Soft, non-tender, non-distended MUSCULOSKELETAL:  No edema; No deformity  SKIN: Warm and dry LOWER EXTREMITIES: no swelling NEUROLOGIC:  Alert and oriented x 3 PSYCHIATRIC:  Normal affect   ASSESSMENT:    1. Status post coronary artery bypass graft   2. Sinus bradycardia   3. Essential hypertension   4. Meniere's disease of both ears   5. Dyslipidemia   6. Preop cardiovascular exam    PLAN:    In order of problems listed above:  Coronary disease status post coronary bypass graft many years ago.  He is doing excellently without any symptomatology.  From description he gave me look like easily he can reach 4 METS.  We will continue antiplatelet therapy, will continue statin Sinus bradycardia no dizziness no passing out today on EKG 61 bpm.  Will avoid beta-blocker. Essential hypertension: Blood pressure is fluctuating.  He tells me when he check it at home blood pressure could be 151 systolic but then he can have blood pressure 165 exactly asking question why.  I will ask him to wear 24 his blood pressure monitor in the future to assess exactly what his blood pressure is doing. Cardiovascular preop evaluation.  Overall carpal tunnel syndrome surgery is considered low risk surgery.  On top of that he is doing very well clinically.  He easily can reach 4 METS doing his exercises, therefore, from cardiac standpoint he should be observed no problem to proceed with surgery.   Medication Adjustments/Labs and Tests Ordered: Current medicines are reviewed at length with the patient today.  Concerns regarding medicines are outlined above.  No orders of the defined types were placed in this  encounter.  Medication changes: No orders of the defined types were placed in this encounter.   Signed, Park Liter, MD, [facility] 01/31/2022 3:41 PM    [place]

## 2022-07-31 ENCOUNTER — Ambulatory Visit: Payer: Medicare HMO | Admitting: Cardiology

## 2022-07-31 ENCOUNTER — Encounter: Payer: Self-pay | Admitting: Cardiology

## 2022-07-31 VITALS — BP 110/72 | HR 59 | Ht 69.0 in | Wt 177.4 lb

## 2022-07-31 DIAGNOSIS — I10 Essential (primary) hypertension: Secondary | ICD-10-CM

## 2022-07-31 DIAGNOSIS — Z951 Presence of aortocoronary bypass graft: Secondary | ICD-10-CM | POA: Diagnosis not present

## 2022-07-31 DIAGNOSIS — I471 Supraventricular tachycardia: Secondary | ICD-10-CM

## 2022-07-31 DIAGNOSIS — E785 Hyperlipidemia, unspecified: Secondary | ICD-10-CM | POA: Diagnosis not present

## 2022-07-31 NOTE — Progress Notes (Signed)
Cardiology Office Note:    Date:  07/31/2022   ID:  Johnny Allen, DOB [birth year], MRN [id number]  PCP:  Angelina Sheriff, MD  Cardiologist:  Jenne Campus, MD    Referring MD: Angelina Sheriff, MD   Chief Complaint  Patient presents with   Follow-up  Doing fine  History of Present Illness:    Johnny Allen is a 81 y.o. male   with past medical history significant for coronary artery disease.  In 2005 he had coronary artery bypass graft done.  Also history of supraventricular tachycardia, sinus bradycardia, essential hypertension, dyslipidemia.  He comes today to my office for follow-u Comes today to my office for follow-up.  Overall he is doing very well like always she is very active he walks every single day 2 miles, denies having any difficulties.  No chest pain tightness squeezing pressure burning chest  Past Medical History:  Diagnosis Date   Acute diffuse otitis externa of left ear 09/04/2017   Coronary artery disease 04/11/2020   Dyslipidemia 04/11/2020   Essential hypertension 04/11/2020   Meniere's disease 09/04/2017   Sinus bradycardia 04/11/2020   Status post coronary artery bypass graft 04/11/2020   SVT (supraventricular tachycardia) ([place]) 05/24/2020    Past Surgical History:  Procedure Laterality Date   NO PAST SURGERIES      Current Medications: Current Meds  Medication Sig   amLODipine (NORVASC) 5 MG tablet Take 5 mg by mouth daily.   aspirin EC 81 MG tablet Take 81 mg by mouth daily.   atorvastatin (LIPITOR) 10 MG tablet Take 10 mg by mouth at bedtime.   CRANBERRY ULTRA STRENGTH PO Take 15,000 mg by mouth daily.   Cyanocobalamin (B-12) 2500 MCG TABS Take 3,000 mg by mouth daily.   dorzolamide-timolol (COSOPT) 22.3-6.8 MG/ML ophthalmic solution Place 1 drop into both eyes 2 (two) times daily.   escitalopram (LEXAPRO) 10 MG tablet Take 10 mg by mouth daily.   hydrochlorothiazide (HYDRODIURIL) 12.5 MG tablet Take 12.5 mg by mouth daily as needed (fluid).    irbesartan (AVAPRO) 150 MG tablet Take 150 mg by mouth daily.   tamsulosin (FLOMAX) 0.4 MG CAPS capsule Take 0.4 mg by mouth daily.   timolol (TIMOPTIC) 0.5 % ophthalmic solution Place 1 drop into both eyes daily.   vitamin C (ASCORBIC ACID) 500 MG tablet Take 500 mg by mouth daily.     Allergies:   Patient has no known allergies.   Social History   Socioeconomic History   Marital status: Married    Spouse name: Not on file   Number of children: Not on file   Years of education: Not on file   Highest education level: Not on file  Occupational History   Not on file  Tobacco Use   Smoking status: Former    Types: Cigarettes    Passive exposure: Past   Smokeless tobacco: Never  Vaping Use   Vaping Use: Never used  Substance and Sexual Activity   Alcohol use: Not Currently   Drug use: Never   Sexual activity: Not on file  Other Topics Concern   Not on file  Social History Narrative   Not on file   Social Determinants of Health   Financial Resource Strain: Not on file  Food Insecurity: Not on file  Transportation Needs: Not on file  Physical Activity: Not on file  Stress: Not on file  Social Connections: Not on file     Family History: The patient's  family history is not on file. ROS:   Please see the history of present illness.    All 14 point review of systems negative except as described per history of present illness  EKGs/Labs/Other Studies Reviewed:      Recent Labs: No results found for requested labs within last 365 days.  Recent Lipid Panel No results found for: "CHOL", "TRIG", "HDL", "CHOLHDL", "VLDL", "LDLCALC", "LDLDIRECT"  Physical Exam:    VS:  BP 110/72 (BP Location: Left Arm, Patient Position: Sitting)   Pulse (!) 59   Ht '5\' 9"'$  (1.753 m)   Wt 177 lb 6.4 oz (80.5 kg)   SpO2 94%   BMI 26.20 kg/m     Wt Readings from Last 3 Encounters:  07/31/22 177 lb 6.4 oz (80.5 kg)  01/31/22 175 lb (79.4 kg)  03/24/21 173 lb (78.5 kg)     GEN:   Well nourished, well developed in no acute distress HEENT: Normal NECK: No JVD; No carotid bruits LYMPHATICS: No lymphadenopathy CARDIAC: RRR, no murmurs, no rubs, no gallops RESPIRATORY:  Clear to auscultation without rales, wheezing or rhonchi  ABDOMEN: Soft, non-tender, non-distended MUSCULOSKELETAL:  No edema; No deformity  SKIN: Warm and dry LOWER EXTREMITIES: no swelling NEUROLOGIC:  Alert and oriented x 3 PSYCHIATRIC:  Normal affect   ASSESSMENT:    1. Status post coronary artery bypass graft   2. Dyslipidemia   3. SVT (supraventricular tachycardia) ([place])   4. Essential hypertension    PLAN:    In order of problems listed above:  Coronary artery disease status post coronary bypass graft done in 2005.  Doing very well.  Denies of any chest pain tightness squeezing pressure burning chest. Dyslipidemia I did review K PN which show me total cholesterol 133 HDL 46.  He is on Lipitor 10 which I will continue History of supraventricular tachycardia, denies have any palpitations Essential hypertension is doing well.   Medication Adjustments/Labs and Tests Ordered: Current medicines are reviewed at length with the patient today.  Concerns regarding medicines are outlined above.  No orders of the defined types were placed in this encounter.  Medication changes: No orders of the defined types were placed in this encounter.   Signed, Park Liter, MD, [facility] 07/31/2022 9:37 AM    [place]

## 2022-07-31 NOTE — Patient Instructions (Signed)

## 2022-10-10 DIAGNOSIS — H833X3 Noise effects on inner ear, bilateral: Secondary | ICD-10-CM

## 2022-10-10 DIAGNOSIS — N401 Enlarged prostate with lower urinary tract symptoms: Secondary | ICD-10-CM

## 2022-10-10 DIAGNOSIS — D649 Anemia, unspecified: Secondary | ICD-10-CM

## 2022-10-10 DIAGNOSIS — H409 Unspecified glaucoma: Secondary | ICD-10-CM

## 2022-10-10 HISTORY — DX: Noise effects on inner ear, bilateral: H83.3X3

## 2022-10-10 HISTORY — DX: Benign prostatic hyperplasia with lower urinary tract symptoms: N40.1

## 2022-10-10 HISTORY — DX: Unspecified glaucoma: H40.9

## 2022-10-10 HISTORY — DX: Anemia, unspecified: D64.9

## 2023-01-31 ENCOUNTER — Ambulatory Visit: Payer: Medicare HMO | Admitting: Cardiology

## 2023-02-01 ENCOUNTER — Ambulatory Visit: Payer: Medicare HMO | Admitting: Cardiology

## 2023-04-23 ENCOUNTER — Ambulatory Visit: Payer: Medicare Other | Attending: Cardiology | Admitting: Cardiology

## 2023-04-23 ENCOUNTER — Encounter: Payer: Self-pay | Admitting: Cardiology

## 2023-04-23 VITALS — BP 106/62 | HR 60 | Ht 68.5 in | Wt 175.2 lb

## 2023-04-23 DIAGNOSIS — R001 Bradycardia, unspecified: Secondary | ICD-10-CM | POA: Diagnosis not present

## 2023-04-23 DIAGNOSIS — E785 Hyperlipidemia, unspecified: Secondary | ICD-10-CM

## 2023-04-23 DIAGNOSIS — I10 Essential (primary) hypertension: Secondary | ICD-10-CM

## 2023-04-23 DIAGNOSIS — Z951 Presence of aortocoronary bypass graft: Secondary | ICD-10-CM | POA: Diagnosis not present

## 2023-04-23 NOTE — Progress Notes (Signed)
Cardiology Office Note:    Date:  04/23/2023   ID:  Johnny Allen, DOB [birth year], MRN [id number]  PCP:  Noni Saupe, MD  Cardiologist:  Gypsy Balsam, MD    Referring MD: Noni Saupe, MD   Chief Complaint  Patient presents with   Follow-up    History of Present Illness:    Johnny Allen is a 82 y.o. male with past medical history significant for coronary artery disease.  In 2005 he did have coronary bypass graft additional problem include supraventricular tachycardia, sinus bradycardia, essential hypertension, dyslipidemia. Comes today to months for follow-up.  Overall doing very well.  He denies of any chest pain tightness squeezing pressure burning chest.  No palpitations dizziness or extremities.  Overall he is doing very well he still walks on the regular basis with no difficulty  Past Medical History:  Diagnosis Date   Acute diffuse otitis externa of left ear 09/04/2017   Anemia 10/10/2022   Benign prostatic hyperplasia with nocturia 10/10/2022   Coronary artery disease 04/11/2020   Dyslipidemia 04/11/2020   Essential hypertension 04/11/2020   Glaucoma 10/10/2022   Meniere's disease 09/04/2017   Noise-induced hearing loss of both ears 10/10/2022   Preop cardiovascular exam 01/31/2022   Sinus bradycardia 04/11/2020   Status post coronary artery bypass graft 04/11/2020   SVT (supraventricular tachycardia) 05/24/2020    Past Surgical History:  Procedure Laterality Date   NO PAST SURGERIES      Current Medications: Current Meds  Medication Sig   amLODipine (NORVASC) 5 MG tablet Take 5 mg by mouth daily.   aspirin EC 81 MG tablet Take 81 mg by mouth daily.   atorvastatin (LIPITOR) 10 MG tablet Take 10 mg by mouth at bedtime.   CRANBERRY ULTRA STRENGTH PO Take 15,000 mg by mouth daily.   Cyanocobalamin (B-12) 2500 MCG TABS Take 3,000 mg by mouth daily.   dorzolamide-timolol (COSOPT) 22.3-6.8 MG/ML ophthalmic solution Place 1 drop into both  eyes 2 (two) times daily.   escitalopram (LEXAPRO) 10 MG tablet Take 10 mg by mouth daily.   hydrochlorothiazide (HYDRODIURIL) 12.5 MG tablet Take 12.5 mg by mouth daily as needed (fluid).   irbesartan (AVAPRO) 150 MG tablet Take 150 mg by mouth daily.   tamsulosin (FLOMAX) 0.4 MG CAPS capsule Take 0.4 mg by mouth daily.   timolol (TIMOPTIC) 0.5 % ophthalmic solution Place 1 drop into both eyes daily.   vitamin C (ASCORBIC ACID) 500 MG tablet Take 500 mg by mouth daily.     Allergies:   Patient has no known allergies.   Social History   Socioeconomic History   Marital status: Married    Spouse name: Not on file   Number of children: Not on file   Years of education: Not on file   Highest education level: Not on file  Occupational History   Not on file  Tobacco Use   Smoking status: Former    Types: Cigarettes    Passive exposure: Past   Smokeless tobacco: Never  Vaping Use   Vaping Use: Never used  Substance and Sexual Activity   Alcohol use: Not Currently   Drug use: Never   Sexual activity: Not on file  Other Topics Concern   Not on file  Social History Narrative   Not on file   Social Determinants of Health   Financial Resource Strain: Not on file  Food Insecurity: Not on file  Transportation Needs: Not on file  Physical Activity:  Not on file  Stress: Not on file  Social Connections: Not on file     Family History: The patient's family history is not on file. ROS:   Please see the history of present illness.    All 14 point review of systems negative except as described per history of present illness  EKGs/Labs/Other Studies Reviewed:      Recent Labs: No results found for requested labs within last 365 days.  Recent Lipid Panel No results found for: "CHOL", "TRIG", "HDL", "CHOLHDL", "VLDL", "LDLCALC", "LDLDIRECT"  Physical Exam:    VS:  BP 106/62 (BP Location: Left Arm, Patient Position: Sitting)   Pulse 60   Ht 5' 8.5" (1.74 m)   Wt 175 lb 3.2  oz (79.5 kg)   SpO2 94%   BMI 26.25 kg/m     Wt Readings from Last 3 Encounters:  04/23/23 175 lb 3.2 oz (79.5 kg)  07/31/22 177 lb 6.4 oz (80.5 kg)  01/31/22 175 lb (79.4 kg)     GEN:  Well nourished, well developed in no acute distress HEENT: Normal NECK: No JVD; No carotid bruits LYMPHATICS: No lymphadenopathy CARDIAC: RRR, no murmurs, no rubs, no gallops RESPIRATORY:  Clear to auscultation without rales, wheezing or rhonchi  ABDOMEN: Soft, non-tender, non-distended MUSCULOSKELETAL:  No edema; No deformity  SKIN: Warm and dry LOWER EXTREMITIES: no swelling NEUROLOGIC:  Alert and oriented x 3 PSYCHIATRIC:  Normal affect   ASSESSMENT:    1. Status post coronary artery bypass graft   2. Essential hypertension   3. Sinus bradycardia   4. Dyslipidemia    PLAN:    In order of problems listed above:  Status post coronary bypass graft.  Doing well from that point review on antiplatelet therapy which I will continue. Dyslipidemia he is taking statin in form of Lipitor 10 which I will continue.  I did review lab work test from primary care physician in November 2023 his LDL was 64 total cholesterol 129, and HDL 42 which is decently controlled Sinus bradycardia denies having dizziness or passing out. Essential hypertension blood pressure well-controlled we will continue present management   Medication Adjustments/Labs and Tests Ordered: Current medicines are reviewed at length with the patient today.  Concerns regarding medicines are outlined above.  Orders Placed This Encounter  Procedures   EKG 12-Lead   ECHOCARDIOGRAM COMPLETE   Medication changes: No orders of the defined types were placed in this encounter.   Signed, Georgeanna Lea, MD, [facility] 04/23/2023 12:54 PM    [facility]

## 2023-04-23 NOTE — Patient Instructions (Signed)
Medication Instructions:  Your physician recommends that you continue on your current medications as directed. Please refer to the Current Medication list given to you today.  *If you need a refill on your cardiac medications before your next appointment, please call your pharmacy*   Lab Work: None If you have labs (blood work) drawn today and your tests are completely normal, you will receive your results only by: MyChart Message (if you have MyChart) OR A paper copy in the mail If you have any lab test that is abnormal or we need to change your treatment, we will call you to review the results.   Testing/Procedures: Your physician has requested that you have an echocardiogram. Echocardiography is a painless test that uses sound waves to create images of your heart. It provides your doctor with information about the size and shape of your heart and how well your heart's chambers and valves are working. This procedure takes approximately one hour. There are no restrictions for this procedure. Please do NOT wear cologne, perfume, aftershave, or lotions (deodorant is allowed). Please arrive 15 minutes prior to your appointment time.    Follow-Up: At [facility], you and your health needs are our priority.  As part of our continuing mission to provide you with exceptional heart care, we have created designated Provider Care Teams.  These Care Teams include your primary Cardiologist (physician) and Advanced Practice Providers (APPs -  Physician Assistants and Nurse Practitioners) who all work together to provide you with the care you need, when you need it.  We recommend signing up for the patient portal called "MyChart".  Sign up information is provided on this After Visit Summary.  MyChart is used to connect with patients for Virtual Visits (Telemedicine).  Patients are able to view lab/test results, encounter notes, upcoming appointments, etc.  Non-urgent messages can be sent to your  provider as well.   To learn more about what you can do with MyChart, go to [email or web address].    Your next appointment:   6 month(s)  Provider:   Robert Krasowski, MD    Other Instructions None  

## 2023-07-11 DIAGNOSIS — R2689 Other abnormalities of gait and mobility: Secondary | ICD-10-CM

## 2023-07-11 HISTORY — DX: Other abnormalities of gait and mobility: R26.89

## 2024-01-20 ENCOUNTER — Encounter: Payer: Self-pay | Admitting: Cardiology

## 2024-01-23 ENCOUNTER — Ambulatory Visit: Payer: HMO | Attending: Cardiology | Admitting: Cardiology

## 2024-01-23 ENCOUNTER — Encounter: Payer: Self-pay | Admitting: Cardiology

## 2024-01-23 VITALS — BP 128/70 | HR 62 | Ht 69.0 in | Wt 177.4 lb

## 2024-01-23 DIAGNOSIS — I10 Essential (primary) hypertension: Secondary | ICD-10-CM | POA: Diagnosis not present

## 2024-01-23 DIAGNOSIS — E785 Hyperlipidemia, unspecified: Secondary | ICD-10-CM | POA: Diagnosis not present

## 2024-01-23 DIAGNOSIS — Z951 Presence of aortocoronary bypass graft: Secondary | ICD-10-CM

## 2024-01-23 DIAGNOSIS — I471 Supraventricular tachycardia, unspecified: Secondary | ICD-10-CM | POA: Diagnosis not present

## 2024-01-23 DIAGNOSIS — I2510 Atherosclerotic heart disease of native coronary artery without angina pectoris: Secondary | ICD-10-CM | POA: Diagnosis not present

## 2024-01-23 NOTE — Progress Notes (Signed)
Cardiology Office Note:    Date:  01/23/2024   ID:  Johnny Allen, DOB Jul 30, 1941, MRN [id number]  PCP:  Everlean Cherry, MD  Cardiologist:  Gypsy Balsam, MD    Referring MD: Noni Saupe, MD   No chief complaint on file.   History of Present Illness:    Johnny Allen is a 83 y.o. male past medical history significant for coronary artery disease.  He did have coronary bypass graft in 2005, additional problem include supraventricular tachycardia, sinus bradycardia, essential hypertension, dyslipidemia. Comes today to months for follow-up he walks on the regular basis every single day he goes for 2 to 3 miles have no difficulty doing it.  Did not notice any changes/decreased ability to exercise overall seems to be doing quite well.  No chest pain tightness squeezing pressure burning chest.  She is hard of hearing so the conversation somewhat difficult  Past Medical History:  Diagnosis Date   Acute diffuse otitis externa of left ear 09/04/2017   Anemia 10/10/2022   Balance problems 07/11/2023   Benign prostatic hyperplasia with nocturia 10/10/2022   Coronary artery disease 04/11/2020   Dyslipidemia 04/11/2020   Essential hypertension 04/11/2020   Glaucoma 10/10/2022   Meniere's disease 09/04/2017   Noise-induced hearing loss of both ears 10/10/2022   Preop cardiovascular exam 01/31/2022   Sinus bradycardia 04/11/2020   Status post coronary artery bypass graft 04/11/2020   SVT (supraventricular tachycardia) (HCC) 05/24/2020    Past Surgical History:  Procedure Laterality Date   NO PAST SURGERIES      Current Medications: Current Meds  Medication Sig   amLODipine (NORVASC) 5 MG tablet Take 5 mg by mouth daily.   aspirin EC 81 MG tablet Take 81 mg by mouth daily.   atorvastatin (LIPITOR) 10 MG tablet Take 10 mg by mouth at bedtime.   CRANBERRY ULTRA STRENGTH PO Take 15,000 mg by mouth daily.   Cyanocobalamin (B-12) 2500 MCG TABS Take 3,000 mg by mouth daily.    dorzolamide-timolol (COSOPT) 22.3-6.8 MG/ML ophthalmic solution Place 1 drop into both eyes 2 (two) times daily.   escitalopram (LEXAPRO) 10 MG tablet Take 10 mg by mouth daily.   hydrochlorothiazide (HYDRODIURIL) 12.5 MG tablet Take 12.5 mg by mouth daily as needed (fluid).   irbesartan (AVAPRO) 150 MG tablet Take 150 mg by mouth daily.   tamsulosin (FLOMAX) 0.4 MG CAPS capsule Take 0.4 mg by mouth daily.   timolol (TIMOPTIC) 0.5 % ophthalmic solution Place 1 drop into both eyes daily.   vitamin C (ASCORBIC ACID) 500 MG tablet Take 500 mg by mouth daily.     Allergies:   Patient has no known allergies.   Social History   Socioeconomic History   Marital status: Married    Spouse name: Not on file   Number of children: Not on file   Years of education: Not on file   Highest education level: Not on file  Occupational History   Not on file  Tobacco Use   Smoking status: Former    Types: Cigarettes    Passive exposure: Past   Smokeless tobacco: Never  Vaping Use   Vaping status: Never Used  Substance and Sexual Activity   Alcohol use: Not Currently   Drug use: Never   Sexual activity: Not on file  Other Topics Concern   Not on file  Social History Narrative   Not on file   Social Drivers of Health   Financial Resource Strain: Not on  file  Food Insecurity: Low Risk  (07/11/2023)   Received from [facility]   Hunger Vital Sign    Worried About Running Out of Food in the Last Year: Never true    Ran Out of Food in the Last Year: Never true  Transportation Needs: No Transportation Needs (07/11/2023)   Received from [facility]    In the past 12 months, has lack of reliable transportation kept you from medical appointments, meetings, work or from getting things needed for daily living? : No  Physical Activity: Not on file  Stress: Not on file  Social Connections: Not on file     Family History: The patient's family history is not on file. ROS:    Please see the history of present illness.    All 14 point review of systems negative except as described per history of present illness  EKGs/Labs/Other Studies Reviewed:         Recent Labs: No results found for requested labs within last 365 days.  Recent Lipid Panel No results found for: "CHOL", "TRIG", "HDL", "CHOLHDL", "VLDL", "LDLCALC", "LDLDIRECT"  Physical Exam:    VS:  BP 128/70   Pulse 62   Ht 5\' 9"  (1.753 m)   Wt 177 lb 6.4 oz (80.5 kg)   SpO2 95%   BMI 26.20 kg/m     Wt Readings from Last 3 Encounters:  01/23/24 177 lb 6.4 oz (80.5 kg)  04/23/23 175 lb 3.2 oz (79.5 kg)  07/31/22 177 lb 6.4 oz (80.5 kg)     GEN:  Well nourished, well developed in no acute distress HEENT: Normal NECK: No JVD; No carotid bruits LYMPHATICS: No lymphadenopathy CARDIAC: RRR, no murmurs, no rubs, no gallops RESPIRATORY:  Clear to auscultation without rales, wheezing or rhonchi  ABDOMEN: Soft, non-tender, non-distended MUSCULOSKELETAL:  No edema; No deformity  SKIN: Warm and dry LOWER EXTREMITIES: no swelling NEUROLOGIC:  Alert and oriented x 3 PSYCHIATRIC:  Normal affect   ASSESSMENT:    1. Coronary artery disease involving native coronary artery of native heart without angina pectoris   2. Essential hypertension   3. SVT (supraventricular tachycardia) (HCC)   4. Dyslipidemia   5. Status post coronary artery bypass graft    PLAN:    In order of problems listed above:  Coronary disease, status post coronary bypass graft.  Doing well from that point review.  Completely asymptomatic we will continue monitoring. Essential hypertension blood pressure well-controlled on appropriate medication which I will continue. Dyslipidemia he is taking Lipitor 10 which is moderate intensity statin will repeat his fasting lipid profile since last close I have is from more than a year ago which show LDL of 64. Supraventricular tachycardia denies having palpitations. Status post coronary  bypass graft noted. He is concerned about his carotid arteries his brother apparently got significant carotic artery stenosis had surgery on end up having stroke with that.  So we will check his carotic ultrasound   Medication Adjustments/Labs and Tests Ordered: Current medicines are reviewed at length with the patient today.  Concerns regarding medicines are outlined above.  No orders of the defined types were placed in this encounter.  Medication changes: No orders of the defined types were placed in this encounter.   Signed, Georgeanna Lea, MD, [facility] 01/23/2024 10:35 AM    [facility]

## 2024-01-23 NOTE — Patient Instructions (Signed)
  Medication Instructions:  Your physician recommends that you continue on your current medications as directed. Please refer to the Current Medication list given to you today.  *If you need a refill on your cardiac medications before your next appointment, please call your pharmacy*   Lab Work: Your physician recommends that you return for lab work in:   Labs today: Lipid  If you have labs (blood work) drawn today and your tests are completely normal, you will receive your results only by: MyChart Message (if you have MyChart) OR A paper copy in the mail If you have any lab test that is abnormal or we need to change your treatment, we will call you to review the results.   Testing/Procedures: Your physician has requested that you have a carotid duplex. This test is an ultrasound of the carotid arteries in your neck. It looks at blood flow through these arteries that supply the brain with blood. Allow one hour for this exam. There are no restrictions or special instructions.    Follow-Up: At [facility], you and your health needs are our priority.  As part of our continuing mission to provide you with exceptional heart care, we have created designated Provider Care Teams.  These Care Teams include your primary Cardiologist (physician) and Advanced Practice Providers (APPs -  Physician Assistants and Nurse Practitioners) who all work together to provide you with the care you need, when you need it.  We recommend signing up for the patient portal called "MyChart".  Sign up information is provided on this After Visit Summary.  MyChart is used to connect with patients for Virtual Visits (Telemedicine).  Patients are able to view lab/test results, encounter notes, upcoming appointments, etc.  Non-urgent messages can be sent to your provider as well.   To learn more about what you can do with MyChart, go to [email or web address].    Your next appointment:   6 month(s)  Provider:    Gypsy Balsam, MD    Other Instructions None

## 2024-01-23 NOTE — Addendum Note (Signed)
Addended by: Roxanne Mins I on: 01/23/2024 11:04 AM   Modules accepted: Orders

## 2024-01-24 ENCOUNTER — Telehealth: Payer: Self-pay | Admitting: Cardiology

## 2024-01-24 NOTE — Telephone Encounter (Signed)
Granddaughter states patient lost a hearing aid and would like to know if anything has been found at the office. Please advise.

## 2024-01-24 NOTE — Telephone Encounter (Signed)
Spoke with pt. Hearing aid had been found

## 2024-02-11 ENCOUNTER — Ambulatory Visit: Payer: HMO | Attending: Cardiology

## 2024-02-11 DIAGNOSIS — E785 Hyperlipidemia, unspecified: Secondary | ICD-10-CM

## 2024-02-11 DIAGNOSIS — Z951 Presence of aortocoronary bypass graft: Secondary | ICD-10-CM

## 2024-02-11 DIAGNOSIS — I471 Supraventricular tachycardia, unspecified: Secondary | ICD-10-CM

## 2024-02-11 DIAGNOSIS — I2510 Atherosclerotic heart disease of native coronary artery without angina pectoris: Secondary | ICD-10-CM

## 2024-02-11 DIAGNOSIS — I10 Essential (primary) hypertension: Secondary | ICD-10-CM

## 2024-02-13 ENCOUNTER — Telehealth: Payer: Self-pay

## 2024-02-13 NOTE — Telephone Encounter (Signed)
 Patient notified of results and verbalized understanding.

## 2024-02-13 NOTE — Telephone Encounter (Signed)
-----   Message from Flossie Dibble sent at 02/11/2024  5:15 PM EST ----- Carotid duplex revealed mild bilateral narrowing of the carotid arteries.  This is a reassuring test result and not uncommon to find mild narrowing.  You are already on aspirin, as well as Lipitor.  These both help with the slight narrowing.  Overall this is a good result, we do not need to repeat imaging for a few years.

## 2024-03-19 DIAGNOSIS — M545 Low back pain, unspecified: Secondary | ICD-10-CM | POA: Insufficient documentation

## 2024-07-27 ENCOUNTER — Encounter: Payer: Self-pay | Admitting: Cardiology

## 2024-07-27 ENCOUNTER — Ambulatory Visit: Attending: Cardiology | Admitting: Cardiology

## 2024-07-27 VITALS — BP 130/70 | HR 62 | Ht 69.0 in | Wt 176.0 lb

## 2024-07-27 DIAGNOSIS — I10 Essential (primary) hypertension: Secondary | ICD-10-CM

## 2024-07-27 DIAGNOSIS — E785 Hyperlipidemia, unspecified: Secondary | ICD-10-CM | POA: Diagnosis not present

## 2024-07-27 DIAGNOSIS — I471 Supraventricular tachycardia, unspecified: Secondary | ICD-10-CM | POA: Diagnosis not present

## 2024-07-27 DIAGNOSIS — I2510 Atherosclerotic heart disease of native coronary artery without angina pectoris: Secondary | ICD-10-CM

## 2024-07-27 DIAGNOSIS — Z951 Presence of aortocoronary bypass graft: Secondary | ICD-10-CM

## 2024-07-27 NOTE — Progress Notes (Unsigned)
 Cardiology Office Note:    Date:  07/27/2024   ID:  Johnny Allen, DOB October 26, 1941, MRN [id number]  PCP:  Magdaline Debby HERO, MD  Cardiologist:  Lamar Fitch, MD    Referring MD: Magdaline Debby HERO, MD   Chief Complaint  Patient presents with   Follow-up    History of Present Illness:    Johnny Allen is a 83 y.o. male past medical history significant for coronary artery disease he did have coronary bypass graft in 2005, additional problem include supraventricular tachycardia, sinus bradycardia, essential hypertension, dyslipidemia.  Comes today to months for follow-up still walks every day 2 to 3 miles have no difficulty doing it no chest pain tightness squeezing pressure mid chest.  Last time he was concerned about his carotic ultrasound which we have done show up to 39% stenosis bilaterally.  Overall he is doing great  Past Medical History:  Diagnosis Date   Acute diffuse otitis externa of left ear 09/04/2017   Anemia 10/10/2022   Balance problems 07/11/2023   Benign prostatic hyperplasia with nocturia 10/10/2022   Coronary artery disease 04/11/2020   Dyslipidemia 04/11/2020   Essential hypertension 04/11/2020   Glaucoma 10/10/2022   Meniere's disease 09/04/2017   Noise-induced hearing loss of both ears 10/10/2022   Preop cardiovascular exam 01/31/2022   Sinus bradycardia 04/11/2020   Status post coronary artery bypass graft 04/11/2020   SVT (supraventricular tachycardia) (HCC) 05/24/2020    Past Surgical History:  Procedure Laterality Date   NO PAST SURGERIES      Current Medications: Current Meds  Medication Sig   amLODipine (NORVASC) 5 MG tablet Take 5 mg by mouth daily.   aspirin EC 81 MG tablet Take 81 mg by mouth daily.   atorvastatin (LIPITOR) 10 MG tablet Take 10 mg by mouth at bedtime.   CRANBERRY ULTRA STRENGTH PO Take 15,000 mg by mouth daily.   Cyanocobalamin (B-12) 2500 MCG TABS Take 3,000 mg by mouth daily.   dorzolamide-timolol (COSOPT) 22.3-6.8  MG/ML ophthalmic solution Place 1 drop into both eyes 2 (two) times daily.   escitalopram (LEXAPRO) 10 MG tablet Take 10 mg by mouth daily.   hydrochlorothiazide (HYDRODIURIL) 12.5 MG tablet Take 12.5 mg by mouth daily as needed (fluid).   irbesartan (AVAPRO) 150 MG tablet Take 150 mg by mouth daily.   tamsulosin (FLOMAX) 0.4 MG CAPS capsule Take 0.4 mg by mouth daily.   timolol (TIMOPTIC) 0.5 % ophthalmic solution Place 1 drop into both eyes daily.   vitamin C (ASCORBIC ACID) 500 MG tablet Take 500 mg by mouth daily.     Allergies:   Patient has no known allergies.   Social History   Socioeconomic History   Marital status: Married    Spouse name: Not on file   Number of children: Not on file   Years of education: Not on file   Highest education level: Not on file  Occupational History   Not on file  Tobacco Use   Smoking status: Former    Types: Cigarettes    Passive exposure: Past   Smokeless tobacco: Never  Vaping Use   Vaping status: Never Used  Substance and Sexual Activity   Alcohol use: Not Currently   Drug use: Never   Sexual activity: Not on file  Other Topics Concern   Not on file  Social History Narrative   Not on file   Social Drivers of Health   Financial Resource Strain: Not on file  Food Insecurity: Low Risk  (  07/11/2023)   Received from [facility]   Hunger Vital Sign    Within the past 12 months, you worried that your food would run out before you got money to buy more: Never true    Within the past 12 months, the food you bought just didn't last and you didn't have money to get more. : Never true  Transportation Needs: No Transportation Needs (07/11/2023)   Received from [facility]    In the past 12 months, has lack of reliable transportation kept you from medical appointments, meetings, work or from getting things needed for daily living? : No  Physical Activity: Not on file  Stress: Not on file  Social Connections: Not on file      Family History: The patient's family history is not on file. ROS:   Please see the history of present illness.    All 14 point review of systems negative except as described per history of present illness  EKGs/Labs/Other Studies Reviewed:    EKG Interpretation Date/Time:  Monday July 27 2024 09:56:57 EDT Ventricular Rate:  62 PR Interval:  184 QRS Duration:  72 QT Interval:  388 QTC Calculation: 393 R Axis:   103  Text Interpretation: Normal sinus rhythm Rightward axis Borderline ECG When compared with ECG of 28-Jan-2004 06:58, T wave inversion less evident in Inferior leads T wave amplitude has increased in Lateral leads Confirmed by Bernie Charleston [id number]) on 07/27/2024 10:08:02 AM    Recent Labs: No results found for requested labs within last 365 days.  Recent Lipid Panel No results found for: CHOL, TRIG, HDL, CHOLHDL, VLDL, LDLCALC, LDLDIRECT  Physical Exam:    VS:  BP 130/70   Pulse 62   Ht 5' 9 (1.753 m)   Wt 176 lb (79.8 kg)   SpO2 97%   BMI 25.99 kg/m     Wt Readings from Last 3 Encounters:  07/27/24 176 lb (79.8 kg)  01/23/24 177 lb 6.4 oz (80.5 kg)  04/23/23 175 lb 3.2 oz (79.5 kg)     GEN:  Well nourished, well developed in no acute distress HEENT: Normal NECK: No JVD; No carotid bruits LYMPHATICS: No lymphadenopathy CARDIAC: RRR, no murmurs, no rubs, no gallops RESPIRATORY:  Clear to auscultation without rales, wheezing or rhonchi  ABDOMEN: Soft, non-tender, non-distended MUSCULOSKELETAL:  No edema; No deformity  SKIN: Warm and dry LOWER EXTREMITIES: no swelling NEUROLOGIC:  Alert and oriented x 3 PSYCHIATRIC:  Normal affect   ASSESSMENT:    1. Essential hypertension   2. Coronary artery disease involving native coronary artery of native heart without angina pectoris   3. SVT (supraventricular tachycardia) (HCC)   4. Dyslipidemia   5. Status post coronary artery bypass graft    PLAN:    In order of problems listed  above:  Coronary disease stable from that point review on guideline directed medical therapy. Essential hypertension blood pressure well-controlled continue present management. Dyslipidemia I do not have any recent fasting lipid profile but I was able to review lab work test done by primary care physician on August 7 of this year which showed HDL 58 LDL 57 continue present management. Status post coronary bypass graft.  Stable. SVT denies having any recurrences. Carotic ultrasound to reveal up to 39% stenosis, continue present management   Medication Adjustments/Labs and Tests Ordered: Current medicines are reviewed at length with the patient today.  Concerns regarding medicines are outlined above.  Orders Placed This Encounter  Procedures  EKG 12-Lead   Medication changes: No orders of the defined types were placed in this encounter.   Signed, Lamar DOROTHA Fitch, MD, [facility] 07/27/2024 10:26 AM    [facility]

## 2024-07-27 NOTE — Patient Instructions (Addendum)
 Medication Instructions:   TAKE: Flomax in the Evening  TAKE: hydrochlorothiazide in the morning   Lab Work: None Ordered If you have labs (blood work) drawn today and your tests are completely normal, you will receive your results only by: MyChart Message (if you have MyChart) OR A paper copy in the mail If you have any lab test that is abnormal or we need to change your treatment, we will call you to review the results.   Testing/Procedures: None Ordered   Follow-Up: At [facility], you and your health needs are our priority.  As part of our continuing mission to provide you with exceptional heart care, we have created designated Provider Care Teams.  These Care Teams include your primary Cardiologist (physician) and Advanced Practice Providers (APPs -  Physician Assistants and Nurse Practitioners) who all work together to provide you with the care you need, when you need it.  We recommend signing up for the patient portal called MyChart.  Sign up information is provided on this After Visit Summary.  MyChart is used to connect with patients for Virtual Visits (Telemedicine).  Patients are able to view lab/test results, encounter notes, upcoming appointments, etc.  Non-urgent messages can be sent to your provider as well.   To learn more about what you can do with MyChart, go to [email or web address].    Your next appointment:   6 month(s)  The format for your next appointment:   In Person  Provider:   Lamar Fitch, MD    Other Instructions NA

## 2024-09-01 ENCOUNTER — Encounter (HOSPITAL_BASED_OUTPATIENT_CLINIC_OR_DEPARTMENT_OTHER): Payer: Self-pay | Admitting: Family Medicine

## 2024-09-01 ENCOUNTER — Ambulatory Visit (INDEPENDENT_AMBULATORY_CARE_PROVIDER_SITE_OTHER): Admitting: Family Medicine

## 2024-09-01 VITALS — BP 146/77 | HR 69 | Temp 97.7°F | Resp 16 | Ht 65.35 in | Wt 175.7 lb

## 2024-09-01 DIAGNOSIS — M1991 Primary osteoarthritis, unspecified site: Secondary | ICD-10-CM | POA: Diagnosis not present

## 2024-09-01 DIAGNOSIS — I471 Supraventricular tachycardia, unspecified: Secondary | ICD-10-CM | POA: Diagnosis not present

## 2024-09-01 DIAGNOSIS — E785 Hyperlipidemia, unspecified: Secondary | ICD-10-CM

## 2024-09-01 DIAGNOSIS — I2510 Atherosclerotic heart disease of native coronary artery without angina pectoris: Secondary | ICD-10-CM

## 2024-09-01 DIAGNOSIS — N400 Enlarged prostate without lower urinary tract symptoms: Secondary | ICD-10-CM | POA: Insufficient documentation

## 2024-09-01 DIAGNOSIS — M1990 Unspecified osteoarthritis, unspecified site: Secondary | ICD-10-CM | POA: Insufficient documentation

## 2024-09-01 NOTE — Assessment & Plan Note (Signed)
Symptoms controlled

## 2024-09-01 NOTE — Progress Notes (Signed)
 Established Patient Office Visit  Subjective   Patient ID: Johnny Allen, male    DOB: 1941/01/28  Age: 83 y.o. MRN: [id number]  No chief complaint on file.   F/u as above.  Formerly known to me at [facility] years ago, but has subsequently gotten care with Dr. Magdaline of [facility].  Overall doing well with few concerns.  Recently had a CPE with Dr. Magdaline prior to Dr. Magdaline retiring.  No old records currently available.  Extended discussion.    Past Medical History:  Diagnosis Date   Anxiety    Coronary artery disease 04/11/2020   Dyslipidemia 04/11/2020   Essential hypertension 04/11/2020   Glaucoma 10/10/2022   Meniere's disease 09/04/2017   f/by Audiologist in [facility]   Noise-induced hearing loss of both ears 10/10/2022   Osteoarthritis    Prostatic hypertrophy    SVT (supraventricular tachycardia) 05/24/2020   f/by Dr. Bernie    Outpatient Encounter Medications as of 09/01/2024  Medication Sig   aspirin EC 81 MG tablet Take 81 mg by mouth daily.   atorvastatin (LIPITOR) 10 MG tablet Take 10 mg by mouth at bedtime.   Cyanocobalamin (B-12) 2500 MCG TABS Take 3,000 mg by mouth daily.   dorzolamide-timolol (COSOPT) 22.3-6.8 MG/ML ophthalmic solution Place 1 drop into both eyes 2 (two) times daily.   escitalopram (LEXAPRO) 10 MG tablet Take 10 mg by mouth daily.   irbesartan (AVAPRO) 150 MG tablet Take 150 mg by mouth daily.   tamsulosin (FLOMAX) 0.4 MG CAPS capsule Take 0.4 mg by mouth daily.   timolol (TIMOPTIC) 0.5 % ophthalmic solution Place 1 drop into both eyes daily.   vitamin C (ASCORBIC ACID) 500 MG tablet Take 500 mg by mouth daily.   amLODipine (NORVASC) 5 MG tablet Take 5 mg by mouth daily. (Patient not taking: Reported on 09/01/2024)   CRANBERRY ULTRA STRENGTH PO Take 15,000 mg by mouth daily. (Patient not taking: Reported on 09/01/2024)   hydrochlorothiazide (HYDRODIURIL) 12.5 MG tablet Take 12.5 mg by mouth daily as needed (fluid). (Patient not taking: Reported  on 09/01/2024)   No facility-administered encounter medications on file as of 09/01/2024.    Social History   Tobacco Use   Smoking status: Never    Passive exposure: Past   Smokeless tobacco: Never  Vaping Use   Vaping status: Never Used  Substance Use Topics   Alcohol use: Not Currently   Drug use: Never      Review of Systems  Constitutional:  Negative for diaphoresis, fever, malaise/fatigue and weight loss.  Respiratory:  Negative for cough, shortness of breath and wheezing.   Cardiovascular:  Negative for chest pain, palpitations, orthopnea, claudication, leg swelling and PND.      Objective:     BP (!) 146/77 (BP Location: Right Arm, Patient Position: Standing, Cuff Size: Normal)   Pulse 69   Temp 97.7 F (36.5 C) (Oral)   Resp 16   Ht 5' 5.35 (1.66 m)   Wt 175 lb 11.2 oz (79.7 kg)   SpO2 94%   BMI 28.92 kg/m    Physical Exam Constitutional:      General: He is not in acute distress.    Appearance: Normal appearance.  HENT:     Head: Normocephalic.  Neck:     Vascular: No carotid bruit.  Cardiovascular:     Rate and Rhythm: Normal rate and regular rhythm.     Pulses: Normal pulses.     Heart sounds: Normal heart sounds.  Pulmonary:     Effort: Pulmonary effort is normal.     Breath sounds: Normal breath sounds.  Abdominal:     General: Bowel sounds are normal.     Palpations: Abdomen is soft.  Musculoskeletal:     Cervical back: Neck supple. No tenderness.     Right lower leg: No edema.     Left lower leg: No edema.  Neurological:     Mental Status: He is alert.      No results found for any visits on 09/01/24.    The ASCVD Risk score (Arnett DK, et al., 2019) failed to calculate for the following reasons:   The 2019 ASCVD risk score is only valid for ages 36 to 72    Assessment & Plan:  Coronary artery disease involving native coronary artery of native heart without angina pectoris Assessment & Plan: Stable.  F/u with Dr.  Krasowski as directed.   SVT (supraventricular tachycardia)  Dyslipidemia Assessment & Plan: Continue statin.  Commended on his weight control and remaining active.   Primary osteoarthritis, unspecified site Assessment & Plan: Satisfactory pain control.  Request records from Dr. Magdaline.  Encouraged a Flu shot and Covid booster next week.   Prostatic hypertrophy Assessment & Plan: Symptoms controlled.     Return in about 3 months (around 12/01/2024) for chronic follow-up.    REDDING PONCE NORLEEN FALCON., MD

## 2024-09-01 NOTE — Assessment & Plan Note (Signed)
 Continue statin.  Commended on his weight control and remaining active.

## 2024-09-01 NOTE — Assessment & Plan Note (Signed)
 Satisfactory pain control.  Request records from Dr. Magdaline.  Encouraged a Flu shot and Covid booster next week.

## 2024-09-01 NOTE — Assessment & Plan Note (Signed)
 Stable.  F/u with Dr. Krasowski as directed.

## 2024-09-17 ENCOUNTER — Other Ambulatory Visit (HOSPITAL_BASED_OUTPATIENT_CLINIC_OR_DEPARTMENT_OTHER): Payer: Self-pay

## 2024-09-17 MED ORDER — FLUZONE HIGH-DOSE 0.5 ML IM SUSY
0.5000 mL | PREFILLED_SYRINGE | Freq: Once | INTRAMUSCULAR | 0 refills | Status: AC
  Filled 2024-09-17: qty 0.5, 1d supply, fill #0

## 2024-12-01 ENCOUNTER — Ambulatory Visit (INDEPENDENT_AMBULATORY_CARE_PROVIDER_SITE_OTHER): Admitting: Family Medicine

## 2024-12-01 ENCOUNTER — Encounter (HOSPITAL_BASED_OUTPATIENT_CLINIC_OR_DEPARTMENT_OTHER): Payer: Self-pay | Admitting: Family Medicine

## 2024-12-01 VITALS — BP 137/77 | Temp 98.2°F | Resp 16 | Wt 174.4 lb

## 2024-12-01 DIAGNOSIS — I10 Essential (primary) hypertension: Secondary | ICD-10-CM

## 2024-12-01 DIAGNOSIS — R5383 Other fatigue: Secondary | ICD-10-CM

## 2024-12-01 DIAGNOSIS — E785 Hyperlipidemia, unspecified: Secondary | ICD-10-CM | POA: Diagnosis not present

## 2024-12-01 DIAGNOSIS — I2510 Atherosclerotic heart disease of native coronary artery without angina pectoris: Secondary | ICD-10-CM

## 2024-12-01 DIAGNOSIS — R269 Unspecified abnormalities of gait and mobility: Secondary | ICD-10-CM | POA: Diagnosis not present

## 2024-12-01 LAB — CBC WITH DIFFERENTIAL/PLATELET
Basophils Absolute: 0 x10E3/uL (ref 0.0–0.2)
Basos: 1 %
EOS (ABSOLUTE): 0.2 x10E3/uL (ref 0.0–0.4)
Eos: 3 %
Hematocrit: 44.7 % (ref 37.5–51.0)
Hemoglobin: 14.8 g/dL (ref 13.0–17.7)
Immature Grans (Abs): 0 x10E3/uL (ref 0.0–0.1)
Immature Granulocytes: 0 %
Lymphocytes Absolute: 1.7 x10E3/uL (ref 0.7–3.1)
Lymphs: 28 %
MCH: 32.2 pg (ref 26.6–33.0)
MCHC: 33.1 g/dL (ref 31.5–35.7)
MCV: 97 fL (ref 79–97)
Monocytes Absolute: 0.8 x10E3/uL (ref 0.1–0.9)
Monocytes: 12 %
Neutrophils Absolute: 3.4 x10E3/uL (ref 1.4–7.0)
Neutrophils: 56 %
Platelets: 167 x10E3/uL (ref 150–450)
RBC: 4.59 x10E6/uL (ref 4.14–5.80)
RDW: 11.7 % (ref 11.6–15.4)
WBC: 6.1 x10E3/uL (ref 3.4–10.8)

## 2024-12-01 LAB — LIPID PANEL
Chol/HDL Ratio: 2.4 ratio (ref 0.0–5.0)
Cholesterol, Total: 113 mg/dL (ref 100–199)
HDL: 47 mg/dL
LDL Chol Calc (NIH): 55 mg/dL (ref 0–99)
Triglycerides: 46 mg/dL (ref 0–149)
VLDL Cholesterol Cal: 11 mg/dL (ref 5–40)

## 2024-12-01 LAB — COMPREHENSIVE METABOLIC PANEL WITH GFR
ALT: 17 IU/L (ref 0–44)
AST: 20 IU/L (ref 0–40)
Albumin: 4.1 g/dL (ref 3.7–4.7)
Alkaline Phosphatase: 66 IU/L (ref 48–129)
BUN/Creatinine Ratio: 25 — ABNORMAL HIGH (ref 10–24)
BUN: 25 mg/dL (ref 8–27)
Bilirubin Total: 0.5 mg/dL (ref 0.0–1.2)
CO2: 24 mmol/L (ref 20–29)
Calcium: 9.3 mg/dL (ref 8.6–10.2)
Chloride: 100 mmol/L (ref 96–106)
Creatinine, Ser: 1.02 mg/dL (ref 0.76–1.27)
Globulin, Total: 2.6 g/dL (ref 1.5–4.5)
Glucose: 98 mg/dL (ref 70–99)
Potassium: 4.7 mmol/L (ref 3.5–5.2)
Sodium: 136 mmol/L (ref 134–144)
Total Protein: 6.7 g/dL (ref 6.0–8.5)
eGFR: 72 mL/min/1.73

## 2024-12-01 LAB — VITAMIN B12: Vitamin B-12: >2000 pg/mL — ABNORMAL HIGH (ref 232–1245)

## 2024-12-01 NOTE — Assessment & Plan Note (Addendum)
 Has worsened over the past 1-2 years.  I will discuss this further with him after his labs are back to be sure this doesn't need further workup or evaluation.

## 2024-12-01 NOTE — Progress Notes (Signed)
 "  Established Patient Office Visit  Subjective   Patient ID: Johnny Allen, male    DOB: 1941-03-23  Age: 83 y.o. MRN: [id number]  Chief Complaint  Patient presents with   Follow-up    Follow-up    Discussed the use of AI scribe software for clinical note transcription with the patient, who gave verbal consent to proceed.  History of Present Illness Johnny Allen is an 83 year old male with hypertension who presents for blood pressure management.  He experiences fluctuations in blood pressure readings. His blood pressure medication is taken at night as prescribed. In the mornings, his blood pressure is typically around 130/70 mmHg, but after walking, it drops to approximately 109/62 mmHg. In the evenings before taking his medication, his blood pressure can rise to 145/72 mmHg.  He notes that his blood pressure tends to increase during the holiday season due to higher salt intake from festive foods, although he generally avoids sugar and tries to manage his diet to control his blood pressure.  He does not require any medication refills at this time as he recently had them refilled.    Past Medical History:  Diagnosis Date   Anxiety    Carotid stenosis, asymptomatic    f/by Cards   Coronary artery disease 04/11/2020   f/by Dr. Bernie   Dyslipidemia 04/11/2020   Essential hypertension 04/11/2020   Glaucoma 10/10/2022   Meniere's disease 09/04/2017   f/by Audiologist in [facility]   Noise-induced hearing loss of both ears 10/10/2022   Osteoarthritis    Prostatic hypertrophy    Skin lesion    f/by [facility]   SVT (supraventricular tachycardia) 05/24/2020   f/by Dr. Bernie    Outpatient Encounter Medications as of 12/01/2024  Medication Sig   aspirin EC 81 MG tablet Take 81 mg by mouth daily.   atorvastatin (LIPITOR) 10 MG tablet Take 10 mg by mouth at bedtime.   Cyanocobalamin (B-12) 2500 MCG TABS Take 3,000 mg by mouth daily.   dorzolamide  (TRUSOPT) 2 % ophthalmic solution 1 drop 2 (two) times daily.   dorzolamide-timolol (COSOPT) 22.3-6.8 MG/ML ophthalmic solution Place 1 drop into both eyes 2 (two) times daily.   escitalopram (LEXAPRO) 10 MG tablet Take 10 mg by mouth daily.   irbesartan (AVAPRO) 150 MG tablet Take 150 mg by mouth daily.   tamsulosin (FLOMAX) 0.4 MG CAPS capsule Take 0.4 mg by mouth daily.   timolol (TIMOPTIC) 0.5 % ophthalmic solution Place 1 drop into both eyes daily.   vitamin C (ASCORBIC ACID) 500 MG tablet Take 500 mg by mouth daily.   amLODipine (NORVASC) 5 MG tablet Take 5 mg by mouth daily. (Patient not taking: Reported on 09/01/2024)   CRANBERRY ULTRA STRENGTH PO Take 15,000 mg by mouth daily. (Patient not taking: Reported on 09/01/2024)   hydrochlorothiazide (HYDRODIURIL) 12.5 MG tablet Take 12.5 mg by mouth daily as needed (fluid). (Patient not taking: Reported on 09/01/2024)   No facility-administered encounter medications on file as of 12/01/2024.    Social History[1]    Review of Systems  Constitutional:  Negative for diaphoresis, fever, malaise/fatigue and weight loss.  Respiratory:  Negative for cough, shortness of breath and wheezing.   Cardiovascular:  Negative for chest pain, palpitations, orthopnea, claudication, leg swelling and PND.      Objective:     BP 137/77 (BP Location: Left Arm, Patient Position: Sitting, Cuff Size: Normal)   Temp 98.2 F (36.8 C) (Oral)   Resp 16  Wt 174 lb 6.4 oz (79.1 kg)   BMI 28.71 kg/m    Physical Exam Constitutional:      General: He is not in acute distress.    Appearance: Normal appearance.     Comments: Stiff gait with cane noted.  HENT:     Head: Normocephalic.  Neck:     Vascular: No carotid bruit.  Cardiovascular:     Rate and Rhythm: Normal rate and regular rhythm.     Pulses: Normal pulses.     Heart sounds: Normal heart sounds.  Pulmonary:     Effort: Pulmonary effort is normal.     Breath sounds: Normal breath sounds.   Abdominal:     General: Bowel sounds are normal.     Palpations: Abdomen is soft.  Musculoskeletal:     Cervical back: Neck supple. No tenderness.     Right lower leg: No edema.     Left lower leg: No edema.  Neurological:     Mental Status: He is alert.      No results found for any visits on 12/01/24.    The ASCVD Risk score (Arnett DK, et al., 2019) failed to calculate for the following reasons:   The 2019 ASCVD risk score is only valid for ages 3 to 69   * - Cholesterol units were assumed    Assessment & Plan:   Assessment & Plan Coronary artery disease involving native coronary artery of native heart without angina pectoris Stable.  F/u with Cardiology as directed. Orders:   CBC with Differential/Platelet   Comprehensive metabolic panel with GFR  Dyslipidemia Low fat diet.  Reevaluate labs today. Orders:   Lipid panel  Fatigue, unspecified type Mild.  Reevaluate labs. Orders:   Vitamin B12  Primary hypertension Blood pressure readings show variability, with morning readings around 109/62 mmHg and evening readings around 145/72 mmHg. Occasional higher readings during holidays due to increased salt intake. Current management with nighttime medication is effective, with acceptable fluctuations. - Continue current antihypertensive regimen with nighttime dosing. - Monitor blood pressure regularly, ensuring readings are taken when standing. - Advised on dietary modifications to reduce salt intake, especially during holidays.    Abnormal gait Has worsened over the past 1-2 years.  I will discuss this further with him after his labs are back to be sure this doesn't need further workup or evaluation.        No follow-ups on file.    Johnny PONCE NORLEEN FALCON., Johnny Allen     [1]  Social History Tobacco Use   Smoking status: Never    Passive exposure: Past   Smokeless tobacco: Never  Vaping Use   Vaping status: Never Used  Substance Use Topics   Alcohol use: Not  Currently   Drug use: Never   "

## 2024-12-01 NOTE — Assessment & Plan Note (Addendum)
 Low fat diet.  Reevaluate labs today. Orders:   Lipid panel

## 2024-12-01 NOTE — Assessment & Plan Note (Addendum)
 Stable.  F/u with Cardiology as directed. Orders:   CBC with Differential/Platelet   Comprehensive metabolic panel with GFR

## 2024-12-01 NOTE — Assessment & Plan Note (Signed)
 Blood pressure readings show variability, with morning readings around 109/62 mmHg and evening readings around 145/72 mmHg. Occasional higher readings during holidays due to increased salt intake. Current management with nighttime medication is effective, with acceptable fluctuations. - Continue current antihypertensive regimen with nighttime dosing. - Monitor blood pressure regularly, ensuring readings are taken when standing. - Advised on dietary modifications to reduce salt intake, especially during holidays.

## 2024-12-04 ENCOUNTER — Ambulatory Visit (HOSPITAL_BASED_OUTPATIENT_CLINIC_OR_DEPARTMENT_OTHER): Payer: Self-pay | Admitting: Family Medicine

## 2024-12-07 ENCOUNTER — Encounter: Payer: Self-pay | Admitting: Cardiology

## 2024-12-07 ENCOUNTER — Ambulatory Visit: Admitting: Cardiology

## 2024-12-07 VITALS — BP 136/66 | HR 78 | Ht 65.0 in | Wt 175.2 lb

## 2024-12-07 DIAGNOSIS — Z951 Presence of aortocoronary bypass graft: Secondary | ICD-10-CM

## 2024-12-07 DIAGNOSIS — E785 Hyperlipidemia, unspecified: Secondary | ICD-10-CM

## 2024-12-07 DIAGNOSIS — R0609 Other forms of dyspnea: Secondary | ICD-10-CM

## 2024-12-07 DIAGNOSIS — I2510 Atherosclerotic heart disease of native coronary artery without angina pectoris: Secondary | ICD-10-CM

## 2024-12-07 DIAGNOSIS — I10 Essential (primary) hypertension: Secondary | ICD-10-CM

## 2024-12-07 NOTE — Progress Notes (Signed)
 " Cardiology Office Note:    Date:  12/07/2024   ID:  Johnny Allen, DOB 14-Sep-1941, MRN [id number]  PCP:  Dottie Norleen PHEBE PONCE, MD  Cardiologist:  Lamar Fitch, MD    Referring MD: Dottie Norleen PHEBE PONCE, MD   No chief complaint on file. Doing well  History of Present Illness:    Johnny Allen is a 83 y.o. male past medical history significant for coronary artery disease in 2005 he did have coronary bypass graft, additional problem clued supraventricular tachycardia, sinus bradycardia, essential hypertension, dyslipidemia he is doing overall very well.  He denies have any chest pain tightness squeezing pressure mid chest no palpitation dizziness swelling of lower extremities he walks 2 to 3 miles every single day  Past Medical History:  Diagnosis Date   Anxiety    Carotid stenosis, asymptomatic    f/by Cards   Coronary artery disease 04/11/2020   f/by Dr. Fitch   Dyslipidemia 04/11/2020   Essential hypertension 04/11/2020   Glaucoma 10/10/2022   Meniere's disease 09/04/2017   f/by Audiologist in [facility]   Noise-induced hearing loss of both ears 10/10/2022   Osteoarthritis    Prostatic hypertrophy    Skin lesion    f/by [facility]   SVT (supraventricular tachycardia) 05/24/2020   f/by Dr. Fitch    Past Surgical History:  Procedure Laterality Date   CORONARY ARTERY BYPASS GRAFT N/A     Current Medications: Active Medications[1]   Allergies:   Patient has no known allergies.   Social History   Socioeconomic History   Marital status: Widowed    Spouse name: Lives alone.   Number of children: Not on file   Years of education: Not on file   Highest education level: Not on file  Occupational History   Occupation: Retired advice worker  Tobacco Use   Smoking status: Never    Passive exposure: Past   Smokeless tobacco: Never  Vaping Use   Vaping status: Never Used  Substance and Sexual Activity   Alcohol use: Not Currently   Drug  use: Never   Sexual activity: Not on file  Other Topics Concern   Not on file  Social History Narrative   Not on file   Social Drivers of Health   Tobacco Use: Low Risk (12/07/2024)   Patient History    Smoking Tobacco Use: Never    Smokeless Tobacco Use: Never    Passive Exposure: Past  Financial Resource Strain: Not on file  Food Insecurity: Low Risk (07/11/2023)   Received from [facility]   Epic    Within the past 12 months, you worried that your food would run out before you got money to buy more: Never true    Within the past 12 months, the food you bought just didn't last and you didn't have money to get more. : Never true  Transportation Needs: No Transportation Needs (07/11/2023)   Received from [facility]    In the past 12 months, has lack of reliable transportation kept you from medical appointments, meetings, work or from getting things needed for daily living? : No  Physical Activity: Not on file  Stress: Not on file  Social Connections: Not on file  Depression (EYV7-0): Not on file  Alcohol Screen: Not on file  Housing: Low Risk (07/11/2023)   Received from [facility]   Epic    What is your living situation today?: I have a steady place to live  Think about the place you live. Do you have problems with any of the following? Choose all that apply:: None/None on this list  Utilities: Low Risk (07/11/2023)   Received from [facility]   Utilities    In the past 12 months has the electric, gas, oil, or water company threatened to shut off services in your home? : No  Health Literacy: Not on file     Family History: The patient's family history is not on file. ROS:   Please see the history of present illness.    All 14 point review of systems negative except as described per history of present illness  EKGs/Labs/Other Studies Reviewed:         Recent Labs: 12/01/2024: ALT 17; BUN 25; Creatinine, Ser 1.02; Hemoglobin 14.8; Platelets  167; Potassium 4.7; Sodium 136  Recent Lipid Panel    Component Value Date/Time   CHOL 113 12/01/2024 1136   TRIG 46 12/01/2024 1136   HDL 47 12/01/2024 1136   CHOLHDL 2.4 12/01/2024 1136   LDLCALC 55 12/01/2024 1136    Physical Exam:    VS:  BP 136/66   Pulse 78   Ht 5' 5 (1.651 m)   Wt 175 lb 3.2 oz (79.5 kg)   SpO2 98%   BMI 29.15 kg/m     Wt Readings from Last 3 Encounters:  12/07/24 175 lb 3.2 oz (79.5 kg)  12/01/24 174 lb 6.4 oz (79.1 kg)  09/01/24 175 lb 11.2 oz (79.7 kg)     GEN:  Well nourished, well developed in no acute distress HEENT: Normal NECK: No JVD; No carotid bruits LYMPHATICS: No lymphadenopathy CARDIAC: RRR, systolic ejection murmur grade 1/6 to 2/6 basilar right upper portion of sternum, no rubs, no gallops RESPIRATORY:  Clear to auscultation without rales, wheezing or rhonchi  ABDOMEN: Soft, non-tender, non-distended MUSCULOSKELETAL:  No edema; No deformity  SKIN: Warm and dry LOWER EXTREMITIES: no swelling NEUROLOGIC:  Alert and oriented x 3 PSYCHIATRIC:  Normal affect   ASSESSMENT:    1. Coronary artery disease involving native coronary artery of native heart without angina pectoris   2. Primary hypertension   3. Status post coronary artery bypass graft   4. Dyslipidemia    PLAN:    In order of problems listed above:  Coronary disease: Doing well asymptomatic exercising on regular basis which encouraged to continue. Essential hypertension blood pressure well-controlled continue present management. Dyslipidemia I did review KPN which show me LDL 55 HDL 47 continue present management. Cortical cell disease not critical continue monitoring. Heart murmur will repeat echocardiogram   Medication Adjustments/Labs and Tests Ordered: Current medicines are reviewed at length with the patient today.  Concerns regarding medicines are outlined above.  No orders of the defined types were placed in this encounter.  Medication changes: No orders  of the defined types were placed in this encounter.   Signed, Lamar DOROTHA Fitch, MD, [facility] 12/07/2024 3:48 PM    [facility]    [1]  Current Meds  Medication Sig   aspirin EC 81 MG tablet Take 81 mg by mouth daily.   atorvastatin (LIPITOR) 10 MG tablet Take 10 mg by mouth at bedtime.   Cyanocobalamin (B-12) 2500 MCG TABS Take 3,000 mg by mouth daily.   dorzolamide (TRUSOPT) 2 % ophthalmic solution 1 drop 2 (two) times daily.   dorzolamide-timolol (COSOPT) 22.3-6.8 MG/ML ophthalmic solution Place 1 drop into both eyes 2 (two) times daily.   escitalopram (LEXAPRO) 10 MG  tablet Take 10 mg by mouth daily.   irbesartan (AVAPRO) 150 MG tablet Take 150 mg by mouth daily.   tamsulosin (FLOMAX) 0.4 MG CAPS capsule Take 0.4 mg by mouth daily.   timolol (TIMOPTIC) 0.5 % ophthalmic solution Place 1 drop into both eyes daily.   vitamin C (ASCORBIC ACID) 500 MG tablet Take 500 mg by mouth daily.   "

## 2024-12-07 NOTE — Patient Instructions (Signed)

## 2024-12-07 NOTE — Addendum Note (Signed)
 Addended by: ARLOA PLANAS D on: 12/07/2024 03:56 PM   Modules accepted: Orders

## 2025-01-01 ENCOUNTER — Ambulatory Visit: Attending: Cardiology

## 2025-01-01 DIAGNOSIS — R0609 Other forms of dyspnea: Secondary | ICD-10-CM

## 2025-01-01 LAB — ECHOCARDIOGRAM COMPLETE
AR max vel: 1.27 cm2
AV Area VTI: 1.23 cm2
AV Area mean vel: 1.16 cm2
AV Mean grad: 15 mmHg
AV Peak grad: 25.3 mmHg
Ao pk vel: 2.52 m/s
Area-P 1/2: 3.66 cm2
MV M vel: 4.79 m/s
MV Peak grad: 91.8 mmHg
S' Lateral: 2.6 cm

## 2025-01-05 ENCOUNTER — Ambulatory Visit: Payer: Self-pay | Admitting: Cardiology

## 2025-01-06 ENCOUNTER — Telehealth: Payer: Self-pay

## 2025-01-06 NOTE — Telephone Encounter (Signed)
 Echo Results reviewed with pt as per Dr. Vanetta Shawl note.  Pt verbalized understanding and had no additional questions. Routed to PCP

## 2025-01-07 ENCOUNTER — Encounter (HOSPITAL_BASED_OUTPATIENT_CLINIC_OR_DEPARTMENT_OTHER): Payer: Self-pay

## 2025-01-07 ENCOUNTER — Ambulatory Visit (HOSPITAL_BASED_OUTPATIENT_CLINIC_OR_DEPARTMENT_OTHER)

## 2025-01-07 VITALS — BP 136/66 | Ht 65.0 in | Wt 168.0 lb

## 2025-01-07 DIAGNOSIS — Z0000 Encounter for general adult medical examination without abnormal findings: Secondary | ICD-10-CM

## 2025-01-07 NOTE — Patient Instructions (Signed)
 [name],  Thank you for taking the time for your Medicare Wellness Visit. I appreciate your continued commitment to your health goals. Please review the care plan we discussed, and feel free to reach out if I can assist you further.  Please note that Annual Wellness Visits do not include a physical exam. Some assessments may be limited, especially if the visit was conducted virtually. If needed, we may recommend an in-person follow-up with your provider.  Ongoing Care Seeing your primary care provider every 3 to 6 months helps us  monitor your health and provide consistent, personalized care.   Referrals If a referral was made during today's visit and you haven't received any updates within two weeks, please contact the referred provider directly to check on the status.  Recommended Screenings:  Health Maintenance  Topic Date Due   Medicare Annual Wellness Visit  Never done   Zoster (Shingles) Vaccine (1 of 2) 10/11/1959   Pneumococcal Vaccine for age over 38 (2 of 2 - PCV) 11/11/2021   DTaP/Tdap/Td vaccine (2 - Td or Tdap) 06/21/2032   Flu Shot  Completed   Meningitis B Vaccine  Aged Out   COVID-19 Vaccine  Discontinued       01/07/2025    4:13 PM  Advanced Directives  Does Patient Have a Medical Advance Directive? Yes  Type of Estate Agent of [place];Living will;Out of facility DNR (pink MOST or yellow form)  Does patient want to make changes to medical advance directive? No - Patient declined  Copy of Healthcare Power of Attorney in Chart? No - copy requested    Vision: Annual vision screenings are recommended for early detection of glaucoma, cataracts, and diabetic retinopathy. These exams can also reveal signs of chronic conditions such as diabetes and high blood pressure.  Dental: Annual dental screenings help detect early signs of oral cancer, gum disease, and other conditions linked to overall health, including heart disease and  diabetes.  Please see the attached documents for additional preventive care recommendations.

## 2025-01-07 NOTE — Progress Notes (Unsigned)
 " I connected with  Johnny Allen on 01/07/25 by a audio enabled telemedicine application and verified that I am speaking with the correct person using two identifiers.  Patient Location: Home  Provider Location: Home Office  Persons Participating in Visit: Patient.  I discussed the limitations of evaluation and management by telemedicine. The patient expressed understanding and agreed to proceed.  Vital Signs: Because this visit was a virtual/telehealth visit, some criteria may be missing or patient reported. Any vitals not documented were not able to be obtained and vitals that have been documented are patient reported.  Chief Complaint  Patient presents with   Medicare Wellness     Subjective:   Johnny Allen is a 84 y.o. male who presents for a Medicare Annual Wellness Visit.  Visit info / Clinical Intake: Medicare Wellness Visit Type:: Subsequent Annual Wellness Visit Persons participating in visit and providing information:: patient Medicare Wellness Visit Mode:: Telephone If telephone:: video declined Since this visit was completed virtually, some vitals may be partially provided or unavailable. Missing vitals are due to the limitations of the virtual format.: Documented vitals are patient reported If Telephone or Video please confirm:: I connected with patient using audio/video enable telemedicine. I verified patient identity with two identifiers, discussed telehealth limitations, and patient agreed to proceed. Patient Location:: home Provider Location:: home office Interpreter Needed?: No Pre-visit prep was completed: yes AWV questionnaire completed by patient prior to visit?: no Living arrangements:: (!) lives alone Patient's Overall Health Status Rating: very good Typical amount of pain: some Does pain affect daily life?: no Are you currently prescribed opioids?: no  Dietary Habits and Nutritional Risks How many meals a day?: 3 Eats fruit and vegetables daily?:  yes Most meals are obtained by: preparing own meals; eating out In the last 2 weeks, have you had any of the following?: none Diabetic:: no  Functional Status Activities of Daily Living (to include ambulation/medication): Independent Ambulation: Independent Medication Administration: Independent Home Management (perform basic housework or laundry): Independent Manage your own finances?: yes Primary transportation is: driving Concerns about vision?: no *vision screening is required for WTM* Concerns about hearing?: no  Fall Screening Falls in the past year?: 0 Number of falls in past year: 0 Was there an injury with Fall?: 0 Fall Risk Category Calculator: 0 Patient Fall Risk Level: Low Fall Risk  Fall Risk Patient at Risk for Falls Due to: No Fall Risks Fall risk Follow up: Falls evaluation completed  Home and Transportation Safety: All rugs have non-skid backing?: N/A, no rugs All stairs or steps have railings?: N/A, no stairs Grab bars in the bathtub or shower?: yes Have non-skid surface in bathtub or shower?: yes Good home lighting?: yes Regular seat belt use?: yes Hospital stays in the last year:: no  Cognitive Assessment Difficulty concentrating, remembering, or making decisions? : no Will 6CIT or Mini Cog be Completed: yes What year is it?: 0 points What month is it?: 0 points Give patient an address phrase to remember (5 components): [address] [place] About what time is it?: 0 points Count backwards from 20 to 1: 0 points Say the months of the year in reverse: 0 points Repeat the address phrase from earlier: 0 points 6 CIT Score: 0 points  Advance Directives (For Healthcare) Does Patient Have a Medical Advance Directive?: Yes Does patient want to make changes to medical advance directive?: No - Patient declined Type of Advance Directive: Healthcare Power of [place]; Living will; Out of facility  DNR (pink MOST or yellow form) Copy of Healthcare Power  of Attorney in Chart?: No - copy requested Copy of Living Will in Chart?: No - copy requested Out of facility DNR (pink MOST or yellow form) in Chart? (Ambulatory ONLY): No - copy requested  Reviewed/Updated  Reviewed/Updated: Reviewed All (Medical, Surgical, Family, Medications, Allergies, Care Teams, Patient Goals)    Allergies (verified) Patient has no known allergies.   Current Medications (verified) Outpatient Encounter Medications as of 01/07/2025  Medication Sig   aspirin EC 81 MG tablet Take 81 mg by mouth daily.   atorvastatin (LIPITOR) 10 MG tablet Take 10 mg by mouth at bedtime.   Cyanocobalamin (B-12) 2500 MCG TABS Take 3,000 mg by mouth daily.   dorzolamide (TRUSOPT) 2 % ophthalmic solution 1 drop 2 (two) times daily.   dorzolamide-timolol (COSOPT) 22.3-6.8 MG/ML ophthalmic solution Place 1 drop into both eyes 2 (two) times daily.   escitalopram (LEXAPRO) 10 MG tablet Take 10 mg by mouth daily.   irbesartan (AVAPRO) 150 MG tablet Take 150 mg by mouth daily.   tamsulosin (FLOMAX) 0.4 MG CAPS capsule Take 0.4 mg by mouth daily.   timolol (TIMOPTIC) 0.5 % ophthalmic solution Place 1 drop into both eyes daily.   vitamin C (ASCORBIC ACID) 500 MG tablet Take 500 mg by mouth daily.   No facility-administered encounter medications on file as of 01/07/2025.    History: Past Medical History:  Diagnosis Date   Anxiety    Carotid stenosis, asymptomatic    f/by Cards   Coronary artery disease 04/11/2020   f/by Dr. 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 Bernie   Past Surgical History:  Procedure Laterality Date   CORONARY ARTERY BYPASS GRAFT N/A     History reviewed. No pertinent family history. Social History   Occupational History   Occupation: Retired advice worker  Tobacco Use   Smoking status: Never    Passive exposure: Past   Smokeless tobacco: Never  Vaping Use   Vaping status: Never Used  Substance and Sexual Activity   Alcohol use: Not Currently   Drug use: Never   Sexual activity: Not on file   Tobacco Counseling Counseling given: Not Answered  SDOH Screenings   Food Insecurity: No Food Insecurity (01/07/2025)  Housing: Low Risk (01/07/2025)  Transportation Needs: No Transportation Needs (01/07/2025)  Utilities: Not At Risk (01/07/2025)  Depression (PHQ2-9): Low Risk (01/07/2025)  Physical Activity: Sufficiently Active (01/07/2025)  Social Connections: Moderately Integrated (01/07/2025)  Stress: No Stress Concern Present (01/07/2025)  Tobacco Use: Low Risk (01/07/2025)  Health Literacy: Adequate Health Literacy (01/07/2025)   See flowsheets for full screening details  Depression Screen PHQ 2 & 9 Depression Scale- Over the past 2 weeks, how often have you been bothered by any of the following problems? Little interest or pleasure in doing things: 0 Feeling down, depressed, or hopeless (PHQ Adolescent also includes...irritable): 0 PHQ-2 Total Score: 0 Trouble falling or staying asleep, or sleeping too much: 0 Feeling tired or having little energy: 0 Poor appetite or overeating (PHQ Adolescent also includes...weight loss): 0 Feeling bad about yourself - or that you are a failure or have let yourself or  your family down: 0 Trouble concentrating on things, such as reading the newspaper or watching television [facility] Adolescent also includes...like school work): 0 Moving or speaking so slowly that other people could have noticed. Or the opposite - being so fidgety or restless that you have been moving around a lot more than usual: 0 Thoughts that you would be better off dead, or of hurting yourself in some way: 0 PHQ-9  Total Score: 0 If you checked off any problems, how difficult have these problems made it for you to do your work, take care of things at home, or get along with other people?: Not difficult at all  Depression Treatment Depression Interventions/Treatment : EYV7-0 Score <4 Follow-up Not Indicated     Goals Addressed               This Visit's Progress     stay healthy (pt-stated)               Objective:    Today's Vitals   01/07/25 1611  BP: 136/66  Weight: 168 lb (76.2 kg)  Height: 5' 5 (1.651 m)   Body mass index is 27.96 kg/m.  Hearing/Vision screen Hearing Screening - Comments:: Patient wears hearing aids  Vision Screening - Comments:: Patient wears glasses. Dr Cleotilde in [place] Immunizations and Health Maintenance Health Maintenance  Topic Date Due   Medicare Annual Wellness (AWV)  Never done   Zoster Vaccines- Shingrix (1 of 2) 10/11/1960   Pneumococcal Vaccine: 50+ Years (2 of 2 - PCV) 11/11/2021   DTaP/Tdap/Td (2 - Td or Tdap) 06/21/2032   Influenza Vaccine  Completed   Meningococcal B Vaccine  Aged Out   COVID-19 Vaccine  Discontinued        Assessment/Plan:  This is a routine wellness examination for Johnny Allen.  Patient Care Team: Dottie Norleen PHEBE PONCE, MD as PCP - General (Family Medicine) Bernie Lamar PARAS, MD as PCP - Cardiology (Cardiology)  I have personally reviewed and noted the following in the patients chart:   Medical and social history Use of alcohol, tobacco or illicit drugs  Current medications and supplements including opioid prescriptions. Functional ability and status Nutritional status Physical activity Advanced directives List of other physicians Hospitalizations, surgeries, and ER visits in previous 12 months Vitals Screenings to include cognitive, depression, and falls Referrals and appointments  No orders of the defined types were placed in this encounter.  In addition, I have reviewed and discussed with  patient certain preventive protocols, quality metrics, and best practice recommendations. A written personalized care plan for preventive services as well as general preventive health recommendations were provided to patient.   Johnny Allen, [place]   01/07/2025   No follow-ups on file.  After Visit Summary: (MyChart) Due to this being a telephonic visit, the after visit summary with patients personalized plan was offered to patient via MyChart   Nurse Notes: patient has no voiced concerns. Patient states he has had vaccinations  "

## 2025-02-09 ENCOUNTER — Ambulatory Visit: Admitting: Cardiology
